# Patient Record
Sex: Female | Born: 1940 | Race: White | Hispanic: No | Marital: Married | Smoking: Former smoker
Health system: Southern US, Community
[De-identification: ages and names within clinical notes are randomized; demographics above are authoritative.]

## PROBLEM LIST (undated history)

## (undated) DIAGNOSIS — M1990 Unspecified osteoarthritis, unspecified site: Secondary | ICD-10-CM

## (undated) DIAGNOSIS — I10 Essential (primary) hypertension: Secondary | ICD-10-CM

## (undated) DIAGNOSIS — K219 Gastro-esophageal reflux disease without esophagitis: Secondary | ICD-10-CM

## (undated) DIAGNOSIS — E785 Hyperlipidemia, unspecified: Secondary | ICD-10-CM

## (undated) DIAGNOSIS — R55 Syncope and collapse: Secondary | ICD-10-CM

## (undated) DIAGNOSIS — I6529 Occlusion and stenosis of unspecified carotid artery: Secondary | ICD-10-CM

## (undated) DIAGNOSIS — I639 Cerebral infarction, unspecified: Secondary | ICD-10-CM

## (undated) HISTORY — DX: Essential (primary) hypertension: I10

## (undated) HISTORY — PX: CAROTID ENDARTERECTOMY: SUR193

## (undated) HISTORY — DX: Cerebral infarction, unspecified: I63.9

## (undated) HISTORY — DX: Gastro-esophageal reflux disease without esophagitis: K21.9

## (undated) HISTORY — DX: Occlusion and stenosis of unspecified carotid artery: I65.29

## (undated) HISTORY — DX: Unspecified osteoarthritis, unspecified site: M19.90

## (undated) HISTORY — PX: ABDOMINAL HYSTERECTOMY: SHX81

## (undated) HISTORY — DX: Syncope and collapse: R55

## (undated) HISTORY — PX: CORONARY ARTERY BYPASS GRAFT: SHX141

## (undated) HISTORY — DX: Hyperlipidemia, unspecified: E78.5

---

## 2003-01-02 DIAGNOSIS — I639 Cerebral infarction, unspecified: Secondary | ICD-10-CM

## 2003-01-02 HISTORY — DX: Cerebral infarction, unspecified: I63.9

## 2005-10-29 ENCOUNTER — Ambulatory Visit: Payer: Self-pay | Admitting: Oncology

## 2006-01-08 ENCOUNTER — Inpatient Hospital Stay (HOSPITAL_COMMUNITY): Admission: RE | Admit: 2006-01-08 | Discharge: 2006-01-09 | Payer: Self-pay | Admitting: Vascular Surgery

## 2006-01-08 ENCOUNTER — Encounter (INDEPENDENT_AMBULATORY_CARE_PROVIDER_SITE_OTHER): Payer: Self-pay | Admitting: Specialist

## 2006-08-09 ENCOUNTER — Ambulatory Visit: Payer: Self-pay | Admitting: Vascular Surgery

## 2007-02-28 ENCOUNTER — Ambulatory Visit: Payer: Self-pay | Admitting: Vascular Surgery

## 2008-02-13 ENCOUNTER — Ambulatory Visit: Payer: Self-pay | Admitting: Vascular Surgery

## 2009-02-18 ENCOUNTER — Ambulatory Visit: Payer: Self-pay | Admitting: Vascular Surgery

## 2010-02-21 ENCOUNTER — Other Ambulatory Visit (INDEPENDENT_AMBULATORY_CARE_PROVIDER_SITE_OTHER): Payer: Medicare Other

## 2010-02-21 DIAGNOSIS — I6529 Occlusion and stenosis of unspecified carotid artery: Secondary | ICD-10-CM

## 2010-02-21 DIAGNOSIS — Z48812 Encounter for surgical aftercare following surgery on the circulatory system: Secondary | ICD-10-CM

## 2010-02-28 NOTE — Procedures (Unsigned)
CAROTID DUPLEX EXAM  INDICATION:  Right carotid endarterectomy.  HISTORY: Diabetes:  No. Cardiac:  No. Hypertension:  Yes. Smoking:  Previous. Previous Surgery:  Right carotid endarterectomy on 01/08/2006. CV History:  TIA in October 2009. Amaurosis Fugax No, Paresthesias No, Hemiparesis No.                                      RIGHT             LEFT Brachial systolic pressure:         144               142 Brachial Doppler waveforms:         Normal            Normal Vertebral direction of flow:        Antegrade         Antegrade DUPLEX VELOCITIES (cm/sec) CCA peak systolic                   79                76 ECA peak systolic                   95                100 ICA peak systolic                   88                92 ICA end diastolic                   15                24 PLAQUE MORPHOLOGY:                                    Heterogenous PLAQUE AMOUNT:                      None              Mild PLAQUE LOCATION:                                      ICA/ECA  IMPRESSION: 1. Patent right carotid endarterectomy site with no right internal     carotid artery stenosis. 2. No hemodynamically significant stenosis of the left internal     carotid artery with plaque formations as described above. 3. Doppler velocities of the right internal carotid artery are less     than previously recorded when compared to the previous examination     on 02/18/2009 with the left internal carotid artery remaining     stable.  ___________________________________________ Larina Earthly, M.D.  CH/MEDQ  D:  02/21/2010  T:  02/21/2010  Job:  [id number]

## 2010-05-16 NOTE — Procedures (Signed)
CAROTID DUPLEX EXAM   INDICATION:  Followup carotid disease.   HISTORY:  Diabetes:  No  Cardiac:  No  Hypertension:  Yes  Smoking:  No  Previous Surgery:  Right carotid endarterectomy with DPA on 01/08/2006,  by Dr. Arbie Cookey.  CV History:  Amaurosis Fugax No, Paresthesias No, Hemiparesis No                                       RIGHT             LEFT  Brachial systolic pressure:         164               168  Brachial Doppler waveforms:         triphasic         triphasic  Vertebral direction of flow:        antegrade         antegrade  DUPLEX VELOCITIES (cm/sec)  CCA peak systolic                   74                70  ECA peak systolic                   157               79  ICA peak systolic                   68                66  ICA end diastolic                   17                15  PLAQUE MORPHOLOGY:                  calcified         calcified  PLAQUE AMOUNT:                      mild to moderate  mild  PLAQUE LOCATION:                    ECA               ICA   IMPRESSION:  1. Right external carotid artery stenosis.  2. No right internal carotid artery stenosis, status post      endarterectomy.  3. A 20-39% left internal carotid artery stenosis.   ___________________________________________  Larina Earthly, M.D.   DP/MEDQ  D:  08/09/2006  T:  08/09/2006  Job:  [id number]

## 2010-05-16 NOTE — Procedures (Signed)
CAROTID DUPLEX EXAM   INDICATION:  Followup of carotid artery disease.  Patient is  asymptomatic.   HISTORY:  Diabetes:  No.  Cardiac:  No.  Hypertension:  Yes.  Smoking:  No.  Previous Surgery:  Right CEA with DPA on 01/08/2006 by Dr. Arbie Cookey.  CV History:  Amaurosis Fugax No, Paresthesias No, Hemiparesis No                                       RIGHT             LEFT  Brachial systolic pressure:         154               160  Brachial Doppler waveforms:         WNL               WNL  Vertebral direction of flow:        Antegrade         Antegrade  DUPLEX VELOCITIES (cm/sec)  CCA peak systolic                   79                82  ECA peak systolic                   114               102  ICA peak systolic                   107 (M)           91  ICA end diastolic                   38                29  PLAQUE MORPHOLOGY:                  Heterogeneous     Mixed, calcific  with shadowing  PLAQUE AMOUNT:                      Intimal thickening                  Mild-moderate  PLAQUE LOCATION:                    Bifurcation       Bifurcation, ICA   IMPRESSION:  1. Right 20-39% ICA stenosis with highest velocities noted at the      distal arteriotomy site.  2. Left 20-39% ICA stenosis.  3. Increased velocities noted in bilateral ICAs, however,      categorically unchanged from study on 08/09/2006.   ___________________________________________  Larina Earthly, M.D.   PB/MEDQ  D:  02/28/2007  T:  03/02/2007  Job:  [id number]

## 2010-05-16 NOTE — Procedures (Signed)
CAROTID DUPLEX EXAM   INDICATION:  Followup known carotid artery disease.   HISTORY:  Diabetes:  No.  Cardiac:  No.  Hypertension:  Yes.  Smoking:  Previous.  Previous Surgery:  Right side carotid endarterectomy 01/08/2006.  CV History:  TIA in October 2009.  Amaurosis Fugax No, Paresthesias No, Hemiparesis No                                       RIGHT             LEFT  Brachial systolic pressure:         160               160  Brachial Doppler waveforms:         WNL               WNL  Vertebral direction of flow:        Antegrade         Antegrade  DUPLEX VELOCITIES (cm/sec)  CCA peak systolic                   94                95  ECA peak systolic                   107               91  ICA peak systolic                   126               97  ICA end diastolic                   40                29  PLAQUE MORPHOLOGY:                                    Heterogeneous  PLAQUE AMOUNT:                                        Mild  PLAQUE LOCATION:                                      ICA   IMPRESSION:  1. No restenosis noted in right internal carotid artery status post      carotid endarterectomy.  2. The left internal carotid artery appears with 20%-39% stenosis.  3. Antegrade flow in bilateral vertebrals.   ___________________________________________  Larina Earthly, M.D.   CB/MEDQ  D:  02/18/2009  T:  02/18/2009  Job:  [id number]

## 2010-05-16 NOTE — Procedures (Signed)
CAROTID DUPLEX EXAM   INDICATION:  Followup right carotid endarterectomy.   HISTORY:  Diabetes:  No.  Cardiac:  No.  Hypertension:  Yes.  Smoking:  No.  Previous Surgery:  Right carotid endarterectomy on 01/08/2006.  CV History:  The patient states a stress induced mini stroke in October  of 2009.  Amaurosis Fugax No, Paresthesias No, Hemiparesis No                                       RIGHT             LEFT  Brachial systolic pressure:         152               158  Brachial Doppler waveforms:         Normal            Normal  Vertebral direction of flow:        Antegrade         Antegrade  DUPLEX VELOCITIES (cm/sec)  CCA peak systolic                   92                85  ECA peak systolic                   120               103  ICA peak systolic                   121               88  ICA end diastolic                   41                22  PLAQUE MORPHOLOGY:                                    Mixed  PLAQUE AMOUNT:                      None              Mild  PLAQUE LOCATION:                                      ICA   IMPRESSION:  1. Patent right carotid endarterectomy site with mildly increased      Doppler velocity noted in the proximal to mid ICA.  This increase      in velocity appears to be most likely due to a change in vessel      diameter since no focal plaque formation was visualized.  2. 1-39% stenosis of the left internal carotid artery.  3. Mild increase in Doppler velocities of the right internal carotid      artery noted when compared to the previous exam on 02/28/2007 with      the left internal carotid artery remaining stable.   ___________________________________________  Larina Earthly, M.D.   CH/MEDQ  D:  02/13/2008  T:  02/13/2008  Job:  [id number] 

## 2010-05-19 NOTE — H&P (Signed)
Susan [name], Susan [name]                 ACCOUNT NO.:  [id number]   MEDICAL RECORD NO.:  [id number]          PATIENT TYPE:  INP   LOCATION:  NA                           FACILITY:  [facility]   PHYSICIAN:  Rowe Clack, P.A.-C. DATE OF BIRTH:  1940/10/17   DATE OF ADMISSION:  01/08/2006  DATE OF DISCHARGE:                              HISTORY & PHYSICAL   CHIEF COMPLAINT:  Right internal carotid artery stenosis.   HISTORY OF PRESENT ILLNESS:  The patient is a 70 year old white female  who has been evaluated for carotid disease status post an episode in  October that was felt to be a possible TIA.  This episode was  approximately 15 minutes in duration and she developed acute onset of  numbness in the right foot and hand while shopping.  This episode  resolved completely and has not recurred.  She has been evaluated with  studies including CT angiography and duplex of the carotid arteries.  This has revealed what is felt to be significant stenosis in the right  internal carotid artery.  The angiogram revealed severe stenosis in the  proximal right ICA with severe calcification at the carotid bifurcation.  She was found on this study to have no significant left internal artery  stenosis.  Her duplex predicted a 60-79% stenosis, but it was Dr.  Bosie Helper opinion that this was an underestimation due to the inability to  visualize the calcified plaque.  During this evaluation the patient was  also found to have a heterozygous Factor 5 Leiden mutation.  This is  felt to be the most likely etiology of her symptoms as the carotid  studies do not correlate with the right-sided episode.  However, due to  the severity of the stenosis on the right side, it was Dr. Bosie Helper  recommendation to proceed with a right carotid endarterectomy so as to  lower her risk of cerebrovascular accident.  She will be admitted this  hospitalization for the procedure.  Currently the patient is completely  asymptomatic and  denies headaches, nausea, vomiting, vertigo, dizziness,  history of recent falls, seizures, numbness, tingling, muscle weakness,  dysarthria, dysphasia, recent visual changes, syncope, presyncope,  memory loss, confusion, or other TIA/CVA symptoms other than the  original event.   PAST MEDICAL HISTORY:  1. 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  2. Factor 5 Leiden mutation.  3. Hypertension.  4. Hypercholesterolemia.   PAST SURGICAL HISTORY:  Partial hysterectomy 1975.   ALLERGIES:  NO KNOWN DRUG ALLERGIES.   CURRENT MEDICATIONS:  1. Lisinopril 10 mg daily.  2. Zocor 20 mg q. p.m.  3. Enteric coated aspirin 325 mg daily.  4. Omega 3 fish oil 1000 mg b.i.d.  5. Niacin 500 mg b.i.d.  6. Xanax 0.125 mg daily.   REVIEW OF SYSTEMS:  See the history of present illness for pertinent  positives and negatives, otherwise she denies contributory.   SOCIAL HISTORY:  She is married with 1 child.  She quit tobacco 20 to 25  years ago and smoked for approximately 15 years at an average of 3/4 of  1 pack per day.  Alcohol use, none.  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   FAMILY HISTORY:  Is remarkable for her father having multiple  cerebrovascular accidents and was deceased following massive myocardial  infarction.  She has one brother who died of colon cancer.  One sister  who died of cancer as well; however, it was metastatic when found and no  primary is known to the patient.   PHYSICAL EXAMINATION:  VITAL SIGNS:  Blood pressure 108/58, heart rate  68, respirations 12.  GENERAL:  A 70 year old Caucasian female in no acute distress.  HEENT:  Normocephalic, atraumatic.  Pupils, equal, round, reactive to  light, extraocular movements intact.  Oral mucosa is pink with no  lesions.  There is no pharyngeal exudates or erythema.  Sclerae  nonicteric.  She does have a upper  denture.  NECK:  Supple, no jugular venous distention.  She does have a left  carotid bruit.  No lymphadenopathy.  No thyromegaly.  PULMONARY:  Symmetrical and inspiration unlabored.  Clear breath sounds.  CARDIAC:  Regular rate and rhythm, no murmurs, gallops, or rubs.  ABDOMINAL:  Soft, nontender, nondistended.  Normal active bowel sounds.  No masses, bruits, or hepatosplenomegaly.  GU/RECTAL:  Deferred.  EXTREMITIES:  No edema, varicosities, or venous stasis.  Her peripheral  pulses are equal intact bilaterally.  Extremities are warm.  NEUROLOGICAL:  Grossly nonfocal.  Alert and oriented x4.  Gait is  steady.  No asymmetry findings.  Muscle strength is 5+ and equal  bilaterally.  Cranial nerves II-XII grossly intact.  Deep tendon  reflexes are intact at 2+.   ASSESSMENT:  1. Severe right internal carotid artery stenosis.  2. Factor 5 Leiden mutation with possible hypocoagulability as      etiology of her right-sided symptoms.   Other diagnoses as previously listed per the history.   PLAN:  Right carotid endarterectomy on January 08, 2006 per Dr. Arbie Cookey.      Rowe Clack, P.A.-C.     Sherryll Burger  D:  01/04/2006  T:  01/04/2006  Job:  [id number]   cc:   Larina Earthly, M.D.  Dr. Lawana Pai

## 2010-05-19 NOTE — Op Note (Signed)
NAMETHERASA, [name] NO.:  [id number]   MEDICAL RECORD NO.:  [id number]          PATIENT TYPE:  INP   LOCATION:  2899                         FACILITY:  [facility]   PHYSICIAN:  Larina Earthly, M.D.    DATE OF BIRTH:  April 04, 1940   DATE OF PROCEDURE:  01/08/2006  DATE OF DISCHARGE:                               OPERATIVE REPORT   PREOPERATIVE DIAGNOSIS:  Severe asymptomatic right internal carotid  stenosis.   POSTOPERATIVE DIAGNOSIS:  Severe asymptomatic right internal carotid  stenosis.   PROCEDURES:  Right carotid endarterectomy and Dacron patch angioplasty.   SURGEON:  Larina Earthly, M.D.   ASSISTANT:  Jerold Coombe, P.A.   ANESTHESIA:  General endotracheal.   COMPLICATIONS:  None.   DISPOSITION:  To recovery room stable.   PROCEDURE IN DETAIL:  The patient was taken to operating, placed supine  position where the area the right neck prepped and draped usual sterile  fashion.  Incision made anterior sternocleidomastoid, carried down  through the platysma with electrocautery.  Sternocleidomastoid reflected  posteriorly and the carotid sheath was opened.  The facial vein was  ligated with 2-0 silk ties and divided.  The common carotid artery was  encircled with an umbilical tape and Rumel tourniquet and had minimal  plaque.  The patient had extensively calcified plaque in the carotid  bulb extending into the internal carotid artery.  The superior thyroid  artery was encircled with a 2-0 silk Potts tie.  The external carotid  was encircled with blue vessel loop.  The internal carotid was encircled  with umbilical tape and Rumel tourniquet.  The patient was given 7000  units intravenous heparin.  After adequate circulation time, the  internal, external and common carotid arteries were occluded.  Common  carotid artery was opened with an 11 blade and extended longitudinally  with Potts scissors through the plaque onto the internal carotid artery.  A 10  shunt was passed up the internal carotid, allowed to back bleed,  and then the common carotid where it was secured with Rumel tourniquets.  The vagus and hypoglossal nerves were identified and preserved.  Endarterectomy was begun on the common carotid artery.  The plaque was  divided proximally with Potts scissors.  The endarterectomy extended  onto the bifurcation.  The external carotid was endarterectomized with  eversion technique and the internal carotid was endarterectomized in  open fashion.  Remaining atheromatous debris was removed from the  endarterectomy plane.  A Finesse Hemashield Dacron patch was brought  onto the field and sewn as a patch angioplasty with a running 6-0  Prolene suture.  Prior to completion of the anastomosis, the shunt was  removed and the usual flushing maneuvers were undertaken.  The  anastomosis was completed and flow was restored first to the external  and then the common, and finally the internal carotid artery occlusion  clamp was removed.  Excellent flow characteristics were noted with  handheld Doppler in the internal, external carotid arteries.  The  patient was given 50 mg of protamine to reverse heparin.  The wounds  were irrigated with saline.  Hemostasis electrocautery.  Wounds closed 3-  0 Vicryl subcutaneous tissue and subcuticular tissue and the wounds was  closed with 3-0 Vicryl to reapproximate  sternocleidomastoid over the carotid sheath.  Next the platysma was  closed to running 3-0 Vicryl sutures and the skin was closed with 4-0  subcuticular Vicryl stitch.  Sterile dressing was applied.  The patient  was taken to the recovery room neurologically intact in stable  condition.      Larina Earthly, M.D.  Electronically Signed     TFE/MEDQ  D:  01/08/2006  T:  01/08/2006  Job:  [id number]   cc:   Dr Veverly Fells Lininger  Dr Levi Aland

## 2010-05-19 NOTE — Discharge Summary (Signed)
[name], SUPPA NO.:  [id number]   MEDICAL RECORD NO.:  [id number]          PATIENT TYPE:  INP   LOCATION:  3310                         FACILITY:  [facility]   PHYSICIAN:  Larina Earthly, M.D.    DATE OF BIRTH:  04-May-1940   DATE OF ADMISSION:  01/08/2006  DATE OF DISCHARGE:  01/09/2006                               DISCHARGE SUMMARY   ADMISSION DIAGNOSIS:  History of severe right internal carotid artery  stenosis.   DISCHARGE/SECONDARY DIAGNOSES:  1. Severe right internal carotid artery stenosis status post right      carotid endarterectomy.  2. History of Factor V Leiden mutation.  3. Hypertension.  4. Hypercholesterolemia.  5. History of partial hysterectomy in 1975.   NO KNOWN DRUG ALLERGIES.   PROCEDURES:  January 08, 2006, right carotid endarterectomy with Dacron  patch angioplasty by Dr. Gretta Began.   BRIEF HISTORY:  Susan [name] is a 70 year old Caucasian female who was  evaluated for carotid disease in October of 2007 after she had an  episode that was possibly due to a  TIA.  The episode was approximately  50 minutes in duration and involved acute onset of numbness in her right  foot and hand while shopping.  The episode resolved completely, and she  had no recurrence.  She subsequently underwent a CT angiography and  duplex of the carotid arteries, which revealed what was thought to be  significant stenosis in the right internal carotid artery.  Angiogram  revealed severe stenosis in the proximal right ICA with severe  calcification in the carotid bifurcation.  She was found in this study  to have no significant left internal carotid artery stenosis.  Her  duplex predicted a 60%-79% stenosis, but it was Dr. Bosie Helper opinion that  it was underestimated due to the inability to visualize calcified  plaque.  During this evaluation, the patient was also found to have a  heterozygous Factor V Leiden mutation.  This was felt to be the more  likely  etiology of her symptoms as her carotid studies did not correlate  with the right-sided episode.  However, due to the severity of the  stenosis on the right, Dr. Arbie Cookey recommended that she proceed with a  right carotid endarterectomy to reduce her risk for cerebrovascular  accident.  After discussing risks and benefits, she agreed to proceed.  As previously described, she was asymptomatic.   HOSPITAL COURSE:  Susan [name] was likely admitted to [facility]  on January 08, 2006, and underwent right carotid endarterectomy.  She was  extubated and neurologically intact.  After a short stay in the recovery  unit , she was transferred to a step-down unit at 3300, where she  remained until discharge.  Susan [name] had an uneventful postoperative  course.  Her vitals remained stable.  She did have some postoperative  mild bradycardia from 50-60 beats per minute; however, this correlated  with her baseline EKG showing sinus bradycardia at 54.  Blood pressure  at 115/42, oxygen saturation 94% on room air.  Postoperative labs were  also stable  showing a white blood count of 10.2, hemoglobin 11.3,  hematocrit 32.9, platelet count 214.  Sodium 137, potassium 3.9, BUN 6,  creatinine 0.77, blood glucose 123.   PHYSICAL EXAMINATION:  HEART:  Regular rate and rhythm.  LUNGS:  Clear.  ABDOMEN:  Benign.  EXTREMITIES:  No edema.  NEUROLOGICALLY:  Intact with tongue in midline.  Her neck incision did  show evidence of small hematoma, the distal incision with some mild soft  tissue edema.  She denies any dysphagia or shortness of breath.   That morning, she was able to tolerate a regular diet, __________  removal of her Foley catheter and ambulate without difficulty.  The pain  was controlled with oral medication.  Basically, she was ready for  discharge home in the afternoon of postoperative day 1, January 09, 2006.   DISCHARGE INSTRUCTIONS:  1. Tylox 1 to 2 tablets p.o. q.4 h. p.r.n. pain.  2.  Lisinopril 10 mg p.o. daily.  3. Zocor 20 mg p.o. q.p.m.  4. Aspirin 325 mg p.o. daily.  5. Omega-3 fish oil capsule 1000 mg p.o. b.i.d.  6. Niaspan 500 mg p.o. b.i.d.  7. Xanax 0.125 mg p.o. daily.   DISCHARGE INSTRUCTIONS:  She is instructed to follow a low-cholesterol  diet, to increase her activity slowly, to avoid driving.  No heavy  lifting for the next 2 weeks.  She may shower or clean her incisions  gently with soap and water.  She should call us if she develops a fever  greater than 101 or redness or drainage from her incision site.  She is  to follow up with Dr. Tawanna Cooler Early at the [facility] office on January 28, 2006,  at 10:30 a.m.  To call sooner if needed.      Jerold Coombe, P.A.      Larina Earthly, M.D.  Electronically Signed    AWZ/MEDQ  D:  02/04/2006  T:  02/05/2006  Job:  [id number]   cc:   Quita Skye. Artis Flock, M.D.

## 2010-12-01 DIAGNOSIS — I2510 Atherosclerotic heart disease of native coronary artery without angina pectoris: Secondary | ICD-10-CM

## 2010-12-04 DIAGNOSIS — I2510 Atherosclerotic heart disease of native coronary artery without angina pectoris: Secondary | ICD-10-CM

## 2011-01-17 ENCOUNTER — Ambulatory Visit (INDEPENDENT_AMBULATORY_CARE_PROVIDER_SITE_OTHER): Payer: Self-pay | Admitting: Physician Assistant

## 2011-01-17 DIAGNOSIS — Z951 Presence of aortocoronary bypass graft: Secondary | ICD-10-CM

## 2011-01-17 NOTE — Progress Notes (Signed)
Ms. Susan [name] return to the office today for scheduled followup after three-vessel coronary bypass grafting by Dr. Arvilla Market on 12/04/2010. Her postoperative course was uncomplicated. She is continuing to make excellent progress since her discharge home. She denies any problems including chest pain or shortness of breath. She continues to have some mild peristernal soreness at this is also improving.  Her medications are reviewed and have not changed since her discharge home. She is seeing Dr. Brett [name] car in [place] for regular cardiology followup.  On exam: Her heart is in a regular rate and rhythm. Her breath sounds are clear to auscultation and are full and equal. The sternotomy incision is well healed. Sternum stable. She has no peripheral edema. A suture is removed from the stab incision at the left lower leg.  Impression: Ms. Susan [name] is making satisfactory progress after three-vessel coronary bypass grafting. Sternal precautions are reviewed.  No changes are made in her medications. She has begun heart strides in is encouraged to continue with his program at the [facility]. No followup is scheduled with our office but she understands he may contact us or return should any problems arise related to her surgery.

## 2011-02-06 ENCOUNTER — Other Ambulatory Visit: Payer: Self-pay | Admitting: Cardiothoracic Surgery

## 2011-03-07 ENCOUNTER — Encounter: Payer: Self-pay | Admitting: Vascular Surgery

## 2011-03-08 ENCOUNTER — Other Ambulatory Visit (INDEPENDENT_AMBULATORY_CARE_PROVIDER_SITE_OTHER): Payer: Medicare Other | Admitting: *Deleted

## 2011-03-08 ENCOUNTER — Ambulatory Visit (INDEPENDENT_AMBULATORY_CARE_PROVIDER_SITE_OTHER): Payer: Medicare Other | Admitting: Neurosurgery

## 2011-03-08 ENCOUNTER — Other Ambulatory Visit: Payer: Self-pay | Admitting: Physician Assistant

## 2011-03-08 VITALS — BP 137/63 | HR 54 | Resp 18 | Ht 63.5 in | Wt 126.0 lb

## 2011-03-08 DIAGNOSIS — I6529 Occlusion and stenosis of unspecified carotid artery: Secondary | ICD-10-CM

## 2011-03-08 DIAGNOSIS — I6522 Occlusion and stenosis of left carotid artery: Secondary | ICD-10-CM | POA: Insufficient documentation

## 2011-03-08 DIAGNOSIS — Z48812 Encounter for surgical aftercare following surgery on the circulatory system: Secondary | ICD-10-CM

## 2011-03-08 NOTE — Progress Notes (Signed)
[facility] [place] HISTORY AND PHYSICAL   CC: Dr. Leonor Liv PCP Referring Physician: Dr.Early  History of Present Illness: Susan [name] is a 71 year old female tha is a patient of Dr.Early. The patient underwent a right carotid endarterectomy in 2008 and has done well since that time. The patient also today for a carotid duplex and was seen in our clinic. She has no new complaints of any symptoms or signs of a CVA nor does she have any visual disturbance or dysplasia  Past Medical History  Diagnosis Date  . Carotid artery occlusion   . Hypertension   . Hyperlipidemia   . Arthritis     ROS: [x]  Positive   [ ]  Denies    General: [ ]  Weight loss, [ ]  Fever, [ ]  chills Neurologic: [ ]  Dizziness, [ ]  Blackouts, [ ]  Seizure [ ]  Stroke, [ ]  "Mini stroke", [ ]  Slurred speech, [ ]  Temporary blindness; [ ]  weakness in arms or legs, [ ]  Hoarseness Cardiac: [ ]  Chest pain/pressure, [ ]  Shortness of breath at rest [ ]  Shortness of breath with exertion, [ ]  Atrial fibrillation or irregular heartbeat Vascular: [ ]  Pain in legs with walking, [ ]  Pain in legs at rest, [ ]  Pain in legs at night,  [ ]  Non-healing ulcer, [ ]  Blood clot in vein/DVT,   Pulmonary: [ ]  Home oxygen, [ ]  Productive cough, [ ]  Coughing up blood, [ ]  Asthma,  [ ]  Wheezing Musculoskeletal:  [ ]  Arthritis, [ ]  Low back pain, [ ]  Joint pain Hematologic: [ ]  Easy Bruising, [ ]  Anemia; [ ]  Hepatitis Gastrointestinal: [ ]  Blood in stool, [ ]  Gastroesophageal Reflux/heartburn, [ ]  Trouble swallowing Urinary: [ ]  chronic Kidney disease, [ ]  on HD - [ ]  MWF or [ ]  TTHS, [ ]  Burning with urination, [ ]  Difficulty urinating Skin: [ ]  Rashes, [ ]  Wounds Psychological: [ ]  Anxiety, [ ]  Depression   Social History History  Substance Use Topics  . Smoking status: Former Smoker -- 0.5 packs/day for 20 years    Types: Cigarettes    Quit date: 03/07/1981  . Smokeless tobacco: Never Used  . Alcohol Use: No    Family  History Family History  Problem Relation Age of Onset  . Heart disease Father     No Known Allergies  Current Outpatient Prescriptions  Medication Sig Dispense Refill  . aspirin 81 MG tablet Take 160 mg by mouth daily.      . calcium-vitamin D 250-100 MG-UNIT per tablet Take 1 tablet by mouth 1 day or 1 dose.      . carvedilol (COREG) 6.25 MG tablet Take 6.25 mg by mouth 2 (two) times daily with a meal.      . Omega-3-acid Ethyl Esters (LOVAZA PO) Take 1,000 mg by mouth daily.      . ramipril (ALTACE) 5 MG capsule Take 5 mg by mouth.      . esomeprazole (NEXIUM) 40 MG capsule Take 40 mg by mouth daily before breakfast.        Physical Examination  Filed Vitals:   03/08/11 1424  BP: 137/63  Pulse: 54  Resp: 18    Body mass index is 21.97 kg/(m^2).  General:  WDWN in NAD Gait: Normal HEENT: WNL Eyes: Pupils equal Pulmonary: normal non-labored breathing , without Rales, rhonchi,  wheezing Cardiac: RRR, without  Murmurs, rubs or gallops; the patient does have a recent history of a three-vessel bypass graft there was  some pleaded at [facility] in December of 2012. She nor her husband can remember the physician's name and and we have no records from there for review Abdomen: soft, NT, no masses Skin: no rashes, ulcers noted  Vascular Exam/Pulses: Patient has palpable distal pulses in the lower extremities bilaterally, 1+ with the dorsalis pedis as well as the posterior tibial. Carotid bruits; there are no audible carotid bruits Extremities without ischemic changes, no Gangrene , no cellulitis; no open wounds;  Musculoskeletal: no muscle wasting or atrophy   Neurologic: A&O X 3; Appropriate Affect ; SENSATION: normal; MOTOR FUNCTION:  moving all extremities equally. Speech is fluent/normal  Non-Invasive Vascular Imaging CAROTID DUPLEX 03/08/2011  Right ICA 0 - 19% stenosis Left ICA 20 - 39 % stenosis   ASSESSMENT/PLAN: The patient will followup in my clinic in one  year with repeat carotid duplex. If they have any difficulty and in her only noted to contact our office or if there any signs of cerebrovascular accident as well as TIA 2 go to the medicine emergency room or nearest emergency room in their vicinity   Clinic MD Dr. Darrick Penna

## 2011-03-08 NOTE — Patient Instructions (Signed)
Discharge Instructions Browse by Alphabet A B C D E F G H I J K L M N O P Q R S T U V W X Y Z  Browse by Category All Documents Allergy and Immunology Anesthesiology Bariatrics Bioterrorism Cardiology Critical Care Dentistry Dermatology Diabetes Dietary Easy-to-Read Emergency Medicine Endocrinology ENT Family Medicine Forms Gastroenterology Geriatrics Infectious Disease Internal Medicine Labs and Tests Neonatology Nephrology Neurology Obstetrics and Gynecology Oncology Ophthalmology Orthopedics Pediatrics Pharmacology Physical Medicine and Rehabilitation Podiatry Preventative Medicine Procedures Psychiatry Pulmonary Medicine Radiology Rheumatology Surgery Urology Drug Information Sheets All Drug Information Sheets  Browse by Alphabet A B C D E F G H I J K L M N O P Q R S T U V W X Y Z  

## 2011-03-15 NOTE — Procedures (Unsigned)
CAROTID DUPLEX EXAM  INDICATION:  Follow up right CEA.  HISTORY: Diabetes:  No. Cardiac:  Yes. Hypertension:  Yes. Smoking:  Previous. Previous Surgery:  Right CEA, 01/08/2006. CV History:  TIA, 2009. Amaurosis Fugax No, Paresthesias No, Hemiparesis No.                                      RIGHT             LEFT Brachial systolic pressure:         130               130 Brachial Doppler waveforms:         WNL               WNL Vertebral direction of flow:        Antegrade         Antegrade DUPLEX VELOCITIES (cm/sec) CCA peak systolic                   85                102 ECA peak systolic                   104               80 ICA peak systolic                   68                104 ICA end diastolic                   12                29 PLAQUE MORPHOLOGY:                                    Calcific PLAQUE AMOUNT:                      NA                Mild PLAQUE LOCATION:                                      ICA  IMPRESSION: 1. Widely patent right internal carotid artery without evidence of     hyperplasia or restenosis. 2. 1% to 39% left internal carotid artery stenosis, which is calcific     and may under-estimate velocities. 3. Bilateral vertebral arteries are within normal limits.  ___________________________________________ Larina Earthly, M.D.  LT/MEDQ  D:  03/08/2011  T:  03/08/2011  Job:  [id number]

## 2012-03-03 ENCOUNTER — Encounter: Payer: Self-pay | Admitting: Vascular Surgery

## 2012-03-04 ENCOUNTER — Ambulatory Visit: Payer: Medicare Other | Admitting: Vascular Surgery

## 2012-03-04 ENCOUNTER — Other Ambulatory Visit: Payer: Medicare Other

## 2012-04-16 ENCOUNTER — Other Ambulatory Visit: Payer: Self-pay | Admitting: *Deleted

## 2012-04-16 DIAGNOSIS — Z48812 Encounter for surgical aftercare following surgery on the circulatory system: Secondary | ICD-10-CM

## 2012-04-28 ENCOUNTER — Encounter: Payer: Self-pay | Admitting: Vascular Surgery

## 2012-04-29 ENCOUNTER — Other Ambulatory Visit (INDEPENDENT_AMBULATORY_CARE_PROVIDER_SITE_OTHER): Payer: Medicare Other | Admitting: Vascular Surgery

## 2012-04-29 ENCOUNTER — Encounter: Payer: Self-pay | Admitting: Vascular Surgery

## 2012-04-29 ENCOUNTER — Ambulatory Visit (INDEPENDENT_AMBULATORY_CARE_PROVIDER_SITE_OTHER): Payer: Medicare Other | Admitting: Vascular Surgery

## 2012-04-29 DIAGNOSIS — I6529 Occlusion and stenosis of unspecified carotid artery: Secondary | ICD-10-CM

## 2012-04-29 DIAGNOSIS — Z48812 Encounter for surgical aftercare following surgery on the circulatory system: Secondary | ICD-10-CM

## 2012-04-29 NOTE — Addendum Note (Signed)
Addended by: Adria Dill L on: 04/29/2012 03:11 PM   Modules accepted: Orders

## 2012-04-29 NOTE — Progress Notes (Signed)
Patient has today for followup of her right carotid endarterectomy for severe asymptomatic disease in 2008. She continues to do well and has had no neurologic deficits. She did undergo coronary bypass grafting in 2011 which was uneventful.  Past Medical History  Diagnosis Date  . Carotid artery occlusion   . Hypertension   . Hyperlipidemia   . Arthritis     History  Substance Use Topics  . Smoking status: Former Smoker -- 0.50 packs/day for 20 years    Types: Cigarettes    Quit date: 03/07/1981  . Smokeless tobacco: Never Used  . Alcohol Use: No    Family History  Problem Relation Age of Onset  . Heart disease Father     No Known Allergies  Current outpatient prescriptions:aspirin 81 MG tablet, Take 160 mg by mouth daily., Disp: , Rfl: ;  calcium-vitamin D 250-100 MG-UNIT per tablet, Take 1 tablet by mouth 1 day or 1 dose., Disp: , Rfl: ;  carvedilol (COREG) 6.25 MG tablet, Take 6.25 mg by mouth 2 (two) times daily with a meal., Disp: , Rfl: ;  cilostazol (PLETAL) 100 MG tablet, Take 100 mg by mouth 2 (two) times daily., Disp: , Rfl:  esomeprazole (NEXIUM) 40 MG capsule, Take 40 mg by mouth daily before breakfast., Disp: , Rfl: ;  niacin 500 MG tablet, Take 500 mg by mouth daily with breakfast., Disp: , Rfl: ;  Omega-3-acid Ethyl Esters (LOVAZA PO), Take 1,000 mg by mouth daily., Disp: , Rfl: ;  pravastatin (PRAVACHOL) 40 MG tablet, Take 40 mg by mouth daily., Disp: , Rfl: ;  ramipril (ALTACE) 5 MG capsule, Take 5 mg by mouth., Disp: , Rfl:   BP 154/73  Pulse 54  Temp(Src) 98 F (36.7 C) (Oral)  Resp 16  Ht 5' 3.5" (1.613 m)  Wt 133 lb (60.328 kg)  BMI 23.19 kg/m2  SpO2 99%  Body mass index is 23.19 kg/(m^2).       Is exam well-developed well-nourished white female appearing stated age. Grossly intact neurologically Her right neck incision is well-healed and she has no carotid bruits bilaterally Heart regular rate and rhythm Pulse status 2+ radial pulses  bilaterally Respirations equal and nonlabored  Noninvasive vascular lab was evaluated interpreted by myself. This reveals widely patent right endarterectomy and no significant stenosis in the left internal carotid artery  Impression and plan: Stable status post right carotid endarterectomy 2008. She will continue her usual activities. We will see her in one year with repeat carotid duplex surveillance

## 2013-05-05 ENCOUNTER — Ambulatory Visit: Payer: Medicare Other | Admitting: Vascular Surgery

## 2013-05-05 ENCOUNTER — Other Ambulatory Visit (HOSPITAL_COMMUNITY): Payer: Self-pay

## 2014-01-15 DIAGNOSIS — I10 Essential (primary) hypertension: Secondary | ICD-10-CM | POA: Diagnosis not present

## 2014-01-15 DIAGNOSIS — E780 Pure hypercholesterolemia: Secondary | ICD-10-CM | POA: Diagnosis not present

## 2014-01-15 DIAGNOSIS — K219 Gastro-esophageal reflux disease without esophagitis: Secondary | ICD-10-CM | POA: Diagnosis not present

## 2014-01-15 DIAGNOSIS — Z0000 Encounter for general adult medical examination without abnormal findings: Secondary | ICD-10-CM | POA: Diagnosis not present

## 2014-04-16 DIAGNOSIS — Z79899 Other long term (current) drug therapy: Secondary | ICD-10-CM | POA: Diagnosis not present

## 2014-04-16 DIAGNOSIS — D509 Iron deficiency anemia, unspecified: Secondary | ICD-10-CM | POA: Diagnosis not present

## 2014-04-16 DIAGNOSIS — I10 Essential (primary) hypertension: Secondary | ICD-10-CM | POA: Diagnosis not present

## 2014-04-16 DIAGNOSIS — Z1389 Encounter for screening for other disorder: Secondary | ICD-10-CM | POA: Diagnosis not present

## 2014-04-16 DIAGNOSIS — E780 Pure hypercholesterolemia: Secondary | ICD-10-CM | POA: Diagnosis not present

## 2014-04-16 DIAGNOSIS — R413 Other amnesia: Secondary | ICD-10-CM | POA: Diagnosis not present

## 2014-04-23 DIAGNOSIS — E538 Deficiency of other specified B group vitamins: Secondary | ICD-10-CM | POA: Diagnosis not present

## 2014-04-30 DIAGNOSIS — E538 Deficiency of other specified B group vitamins: Secondary | ICD-10-CM | POA: Diagnosis not present

## 2014-05-07 DIAGNOSIS — D510 Vitamin B12 deficiency anemia due to intrinsic factor deficiency: Secondary | ICD-10-CM | POA: Diagnosis not present

## 2014-05-17 DIAGNOSIS — D510 Vitamin B12 deficiency anemia due to intrinsic factor deficiency: Secondary | ICD-10-CM | POA: Diagnosis not present

## 2014-06-01 DIAGNOSIS — J189 Pneumonia, unspecified organism: Secondary | ICD-10-CM | POA: Diagnosis not present

## 2014-06-15 DIAGNOSIS — D510 Vitamin B12 deficiency anemia due to intrinsic factor deficiency: Secondary | ICD-10-CM | POA: Diagnosis not present

## 2014-07-07 DIAGNOSIS — H25813 Combined forms of age-related cataract, bilateral: Secondary | ICD-10-CM | POA: Diagnosis not present

## 2014-07-07 DIAGNOSIS — H40003 Preglaucoma, unspecified, bilateral: Secondary | ICD-10-CM | POA: Diagnosis not present

## 2014-07-15 DIAGNOSIS — E780 Pure hypercholesterolemia: Secondary | ICD-10-CM | POA: Diagnosis not present

## 2014-07-15 DIAGNOSIS — D510 Vitamin B12 deficiency anemia due to intrinsic factor deficiency: Secondary | ICD-10-CM | POA: Diagnosis not present

## 2014-07-15 DIAGNOSIS — R252 Cramp and spasm: Secondary | ICD-10-CM | POA: Diagnosis not present

## 2014-10-20 DIAGNOSIS — I739 Peripheral vascular disease, unspecified: Secondary | ICD-10-CM | POA: Diagnosis not present

## 2014-10-20 DIAGNOSIS — Z23 Encounter for immunization: Secondary | ICD-10-CM | POA: Diagnosis not present

## 2014-10-20 DIAGNOSIS — I10 Essential (primary) hypertension: Secondary | ICD-10-CM | POA: Diagnosis not present

## 2014-10-20 DIAGNOSIS — Z79899 Other long term (current) drug therapy: Secondary | ICD-10-CM | POA: Diagnosis not present

## 2014-10-20 DIAGNOSIS — E7800 Pure hypercholesterolemia, unspecified: Secondary | ICD-10-CM | POA: Diagnosis not present

## 2014-11-05 ENCOUNTER — Other Ambulatory Visit: Payer: Self-pay

## 2014-11-05 DIAGNOSIS — I739 Peripheral vascular disease, unspecified: Secondary | ICD-10-CM

## 2014-11-15 DIAGNOSIS — K219 Gastro-esophageal reflux disease without esophagitis: Secondary | ICD-10-CM | POA: Diagnosis not present

## 2014-11-23 ENCOUNTER — Other Ambulatory Visit: Payer: Self-pay

## 2014-11-23 DIAGNOSIS — K449 Diaphragmatic hernia without obstruction or gangrene: Secondary | ICD-10-CM | POA: Diagnosis not present

## 2014-11-23 DIAGNOSIS — R1310 Dysphagia, unspecified: Secondary | ICD-10-CM | POA: Diagnosis not present

## 2014-11-23 DIAGNOSIS — Z951 Presence of aortocoronary bypass graft: Secondary | ICD-10-CM | POA: Diagnosis not present

## 2014-11-23 DIAGNOSIS — K219 Gastro-esophageal reflux disease without esophagitis: Secondary | ICD-10-CM | POA: Diagnosis not present

## 2014-11-23 DIAGNOSIS — I10 Essential (primary) hypertension: Secondary | ICD-10-CM | POA: Diagnosis not present

## 2014-11-23 DIAGNOSIS — K2270 Barrett's esophagus without dysplasia: Secondary | ICD-10-CM | POA: Diagnosis not present

## 2014-11-23 DIAGNOSIS — Z800 Family history of malignant neoplasm of digestive organs: Secondary | ICD-10-CM | POA: Diagnosis not present

## 2014-11-23 DIAGNOSIS — Z8673 Personal history of transient ischemic attack (TIA), and cerebral infarction without residual deficits: Secondary | ICD-10-CM | POA: Diagnosis not present

## 2014-12-08 ENCOUNTER — Encounter: Payer: Self-pay | Admitting: Vascular Surgery

## 2014-12-14 ENCOUNTER — Encounter: Payer: Self-pay | Admitting: Vascular Surgery

## 2014-12-14 ENCOUNTER — Ambulatory Visit (INDEPENDENT_AMBULATORY_CARE_PROVIDER_SITE_OTHER): Payer: Medicare Other | Admitting: Vascular Surgery

## 2014-12-14 ENCOUNTER — Ambulatory Visit (HOSPITAL_COMMUNITY)
Admission: RE | Admit: 2014-12-14 | Discharge: 2014-12-14 | Disposition: A | Discharge status: Home / Self Care | Payer: Medicare Other | Source: Ambulatory Visit | Attending: Vascular Surgery | Admitting: Vascular Surgery

## 2014-12-14 VITALS — BP 134/67 | HR 50 | Temp 97.8°F | Resp 18 | Ht 63.0 in | Wt 126.5 lb

## 2014-12-14 DIAGNOSIS — R938 Abnormal findings on diagnostic imaging of other specified body structures: Secondary | ICD-10-CM | POA: Diagnosis not present

## 2014-12-14 DIAGNOSIS — I10 Essential (primary) hypertension: Secondary | ICD-10-CM | POA: Insufficient documentation

## 2014-12-14 DIAGNOSIS — I739 Peripheral vascular disease, unspecified: Secondary | ICD-10-CM | POA: Insufficient documentation

## 2014-12-14 DIAGNOSIS — E785 Hyperlipidemia, unspecified: Secondary | ICD-10-CM | POA: Diagnosis not present

## 2014-12-14 NOTE — Progress Notes (Signed)
HISTORY AND PHYSICAL     CC:  Painful legs Referring Provider:  Marylen PontoHolt, Lynley S, MD  HPI: This is a 74 y.o. female who presents today for cramping and tingling in both her legs.  She states that this can happen at night or during the day.  At night when this happens, she has to get up and walk around.  She states that it has been going on for a couple of months, but has improved over the past month.  She is using horse liniment and drinking quinine water, which seems to help.  She does have claudication symptoms where both of her legs get tired after walking and improves with rest.  The distance she is able to walk varies.   She had a CABG in 2012, which [facility].  She did have left saphenous vein harvest.   She has a hx of right CEA by Dr. Arbie CookeyEarly in 2008.  She has done well and has not had any stroke symptoms.   She has her carotids checked by her cardiologist.   She states that her medical doctor checked her for diabetes and it was negative.    She does take a beta blocker and ACEI for hypertension management.  She is on a statin for cholesterol management.  She takes a daily aspirin.  Past Medical History  Diagnosis Date  . Carotid artery occlusion   . Hypertension   . Hyperlipidemia   . Arthritis   . GERD (gastroesophageal reflux disease)   . Stroke [facility](HCC) 2005    Past Surgical History  Procedure Laterality Date  . Carotid endarterectomy    . Coronary artery bypass graft    . Abdominal hysterectomy      No Known Allergies  Current Outpatient Prescriptions  Medication Sig Dispense Refill  . aspirin 81 MG tablet Take 325 mg by mouth daily.     . calcium-vitamin D 250-100 MG-UNIT per tablet Take 1 tablet by mouth 1 day or 1 dose.    . carvedilol (COREG) 6.25 MG tablet Take 6.25 mg by mouth 2 (two) times daily with a meal.    . cyanocobalamin 1000 MCG tablet Take 1,000 mcg by mouth daily.    Marland Kitchen. esomeprazole (NEXIUM) 40 MG capsule Take 40 mg by mouth daily before  breakfast.    . Magnesium 500 MG CAPS Take 500 mg by mouth daily.    . Omega-3-acid Ethyl Esters (LOVAZA PO) Take 1,000 mg by mouth daily.    . pravastatin (PRAVACHOL) 40 MG tablet Take 40 mg by mouth daily.    . ramipril (ALTACE) 5 MG capsule Take 5 mg by mouth.    . cilostazol (PLETAL) 100 MG tablet Take 100 mg by mouth 2 (two) times daily.    . niacin 500 MG tablet Take 500 mg by mouth daily with breakfast.     No current facility-administered medications for this visit.    Family History  Problem Relation Age of Onset  . Heart disease Father     Social History   Social History  . Marital Status: Married    Spouse Name: N/A  . Number of Children: N/A  . Years of Education: N/A   Occupational History  . Not on file.   Social History Main Topics  . Smoking status: Former Smoker -- 0.50 packs/day for 20 years    Types: Cigarettes    Quit date: 03/07/1981  . Smokeless tobacco: Never Used  . Alcohol Use: No  .  Drug Use: No  . Sexual Activity: Not on file   Other Topics Concern  . Not on file   Social History Narrative     ROS:  Positive    Negative    All sytems reviewed and are negative  Cardiovascular:  chest pain/pressure  palpitations  SOB lying flat  DOE  pain in legs while walking as well as cramping in legs at night  pain in feet when lying flat  hx of DVT  hx of phlebitis  swelling in legs  varicose veins  Pulmonary:  productive cough  asthma  wheezing  Neurologic:  weakness in  arms  legs  numbness in  arms  legs difficulty speaking or slurred speech  temporary loss of vision in one eye  dizziness  Hematologic:  bleeding problems  problems with blood clotting easily  GI  vomiting blood  blood in stool  GU:  burning with urination  blood in urine  Psychiatric:  hx of major depression  Integumentary:  rashes  ulcers  Constitutional:  fever   chills   PHYSICAL EXAMINATION:  Filed Vitals:   12/14/14 1246  BP: 134/67  Pulse: 50  Temp: 97.8 F (36.6 C)  Resp: 18   Body mass index is 22.41 kg/(m^2).  General:  WDWN in NAD Gait: Not observed HENT: WNL, normocephalic Pulmonary: normal non-labored breathing , without Rales, rhonchi,  wheezing Cardiac: RRR, without  Murmurs, rubs or gallops; without carotid bruits Abdomen: soft, NT, no masses Skin: without rashes; well healed right CEA scar; well healed sternotomy scar; well healed EVH scar left leg.  Vascular Exam/Pulses:  Right Left  Radial 2+ (normal) 2+ (normal)  Femoral 2+ (normal) 1+ (weak)  Popliteal Unable to palpate  Unable to palpate   DP Unable to palpate  Unable to palpate   PT Unable to palpate  Unable to palpate    Extremities: without ischemic changes, without Gangrene , without cellulitis; without open wounds;  Musculoskeletal: no muscle wasting or atrophy  Neurologic: A&O X 3; Appropriate Affect ; SENSATION: normal; MOTOR FUNCTION:  moving all extremities equally. Speech is fluent/normal   Non-Invasive Vascular Imaging:   ABI's 12/14/14: Right: 0.57 Left:  0.55  TBI's 12/14/14: Right:  <0.69 Left:  <0.69  Pt meds includes: Statin:  Yes.   Beta Blocker:  Yes.   Aspirin:  Yes.   ACEI:  Yes.   ARB:  No. Other Antiplatelet/Anticoagulant:  Yes.   Pletal   ASSESSMENT/PLAN:: 74 y.o. female with claudication    -pt does describes symptoms of claudication and ABI's do indicate moderate occlusive disease bilaterally. -she does not have any non healing wounds and she is able to do what she needs to do daily. -Dr. Arbie Cookey discussed with her that if she develops non healing wounds or if her claudication becomes unbearable, then we can proceed with arteriogram with possible intervention. -she does have hx of right carotid endarterectomy.  She states that this is being followed with ultrasound by her cardiologist, which is fine as long as it is  being followed.  -discussed with pt that improving blood flow to her legs will most likely not improve her cramping/tingling sensations.  Continue quinine water and horse liniment for symptoms. -she will f/u with Dr. Arbie Cookey as needed.   She knows to call if she develops non healing wounds or if her claudication sx worsen.   Doreatha Massed, PA-C [facility] [phone]  Clinic MD:  Pt seen and examined in  conjunction with Dr. Arbie Cookey  I have examined the patient, reviewed and agree with above. Majority of symptoms did not appear to be related to arterial insufficiency. She does have arterial claudication but this is tolerable. She will monitor this and notify should she develop progressive symptoms  Gretta Began, MD 12/14/2014 3:07 PM

## 2014-12-20 DIAGNOSIS — K219 Gastro-esophageal reflux disease without esophagitis: Secondary | ICD-10-CM | POA: Diagnosis not present

## 2014-12-20 DIAGNOSIS — K2270 Barrett's esophagus without dysplasia: Secondary | ICD-10-CM | POA: Diagnosis not present

## 2015-01-21 DIAGNOSIS — H524 Presbyopia: Secondary | ICD-10-CM | POA: Diagnosis not present

## 2015-01-21 DIAGNOSIS — H25813 Combined forms of age-related cataract, bilateral: Secondary | ICD-10-CM | POA: Diagnosis not present

## 2015-01-28 DIAGNOSIS — B309 Viral conjunctivitis, unspecified: Secondary | ICD-10-CM | POA: Diagnosis not present

## 2015-02-16 DIAGNOSIS — I10 Essential (primary) hypertension: Secondary | ICD-10-CM | POA: Diagnosis not present

## 2015-02-16 DIAGNOSIS — I739 Peripheral vascular disease, unspecified: Secondary | ICD-10-CM | POA: Diagnosis not present

## 2015-02-16 DIAGNOSIS — E785 Hyperlipidemia, unspecified: Secondary | ICD-10-CM | POA: Diagnosis not present

## 2015-02-16 DIAGNOSIS — R413 Other amnesia: Secondary | ICD-10-CM | POA: Diagnosis not present

## 2015-02-16 DIAGNOSIS — Z79899 Other long term (current) drug therapy: Secondary | ICD-10-CM | POA: Diagnosis not present

## 2015-02-16 DIAGNOSIS — Z0000 Encounter for general adult medical examination without abnormal findings: Secondary | ICD-10-CM | POA: Diagnosis not present

## 2015-04-08 DIAGNOSIS — K529 Noninfective gastroenteritis and colitis, unspecified: Secondary | ICD-10-CM | POA: Diagnosis not present

## 2015-04-13 DIAGNOSIS — J189 Pneumonia, unspecified organism: Secondary | ICD-10-CM | POA: Diagnosis not present

## 2015-04-13 DIAGNOSIS — R062 Wheezing: Secondary | ICD-10-CM | POA: Diagnosis not present

## 2015-05-04 DIAGNOSIS — Z9181 History of falling: Secondary | ICD-10-CM | POA: Diagnosis not present

## 2015-05-04 DIAGNOSIS — Z1389 Encounter for screening for other disorder: Secondary | ICD-10-CM | POA: Diagnosis not present

## 2015-05-04 DIAGNOSIS — Z8701 Personal history of pneumonia (recurrent): Secondary | ICD-10-CM | POA: Diagnosis not present

## 2015-07-08 DIAGNOSIS — E785 Hyperlipidemia, unspecified: Secondary | ICD-10-CM | POA: Insufficient documentation

## 2015-07-11 DIAGNOSIS — Z6821 Body mass index (BMI) 21.0-21.9, adult: Secondary | ICD-10-CM | POA: Diagnosis not present

## 2015-07-11 DIAGNOSIS — I25119 Atherosclerotic heart disease of native coronary artery with unspecified angina pectoris: Secondary | ICD-10-CM | POA: Diagnosis not present

## 2015-07-11 DIAGNOSIS — I739 Peripheral vascular disease, unspecified: Secondary | ICD-10-CM | POA: Diagnosis not present

## 2015-07-11 DIAGNOSIS — E782 Mixed hyperlipidemia: Secondary | ICD-10-CM | POA: Diagnosis not present

## 2015-07-11 DIAGNOSIS — Z8673 Personal history of transient ischemic attack (TIA), and cerebral infarction without residual deficits: Secondary | ICD-10-CM | POA: Diagnosis not present

## 2015-07-11 DIAGNOSIS — I10 Essential (primary) hypertension: Secondary | ICD-10-CM | POA: Diagnosis not present

## 2015-07-11 DIAGNOSIS — E119 Type 2 diabetes mellitus without complications: Secondary | ICD-10-CM | POA: Diagnosis not present

## 2015-07-13 DIAGNOSIS — I739 Peripheral vascular disease, unspecified: Secondary | ICD-10-CM | POA: Diagnosis not present

## 2015-07-13 DIAGNOSIS — I10 Essential (primary) hypertension: Secondary | ICD-10-CM | POA: Diagnosis not present

## 2015-07-13 DIAGNOSIS — E119 Type 2 diabetes mellitus without complications: Secondary | ICD-10-CM | POA: Diagnosis not present

## 2015-07-13 DIAGNOSIS — I25119 Atherosclerotic heart disease of native coronary artery with unspecified angina pectoris: Secondary | ICD-10-CM | POA: Diagnosis not present

## 2015-07-13 DIAGNOSIS — Z8673 Personal history of transient ischemic attack (TIA), and cerebral infarction without residual deficits: Secondary | ICD-10-CM | POA: Diagnosis not present

## 2015-07-13 DIAGNOSIS — E782 Mixed hyperlipidemia: Secondary | ICD-10-CM | POA: Diagnosis not present

## 2015-09-02 ENCOUNTER — Other Ambulatory Visit: Payer: Self-pay

## 2015-10-11 DIAGNOSIS — Z79899 Other long term (current) drug therapy: Secondary | ICD-10-CM | POA: Diagnosis not present

## 2015-10-11 DIAGNOSIS — Z23 Encounter for immunization: Secondary | ICD-10-CM | POA: Diagnosis not present

## 2015-10-11 DIAGNOSIS — E785 Hyperlipidemia, unspecified: Secondary | ICD-10-CM | POA: Diagnosis not present

## 2015-10-11 DIAGNOSIS — G2581 Restless legs syndrome: Secondary | ICD-10-CM | POA: Diagnosis not present

## 2015-10-11 DIAGNOSIS — I10 Essential (primary) hypertension: Secondary | ICD-10-CM | POA: Diagnosis not present

## 2015-11-16 DIAGNOSIS — K2270 Barrett's esophagus without dysplasia: Secondary | ICD-10-CM | POA: Diagnosis not present

## 2015-11-16 DIAGNOSIS — K219 Gastro-esophageal reflux disease without esophagitis: Secondary | ICD-10-CM | POA: Diagnosis not present

## 2015-12-28 DIAGNOSIS — K219 Gastro-esophageal reflux disease without esophagitis: Secondary | ICD-10-CM | POA: Diagnosis not present

## 2015-12-28 DIAGNOSIS — K2270 Barrett's esophagus without dysplasia: Secondary | ICD-10-CM | POA: Diagnosis not present

## 2015-12-28 DIAGNOSIS — K644 Residual hemorrhoidal skin tags: Secondary | ICD-10-CM | POA: Diagnosis not present

## 2016-01-07 DIAGNOSIS — J209 Acute bronchitis, unspecified: Secondary | ICD-10-CM | POA: Diagnosis not present

## 2016-01-07 DIAGNOSIS — R062 Wheezing: Secondary | ICD-10-CM | POA: Diagnosis not present

## 2016-01-12 DIAGNOSIS — J45901 Unspecified asthma with (acute) exacerbation: Secondary | ICD-10-CM | POA: Diagnosis not present

## 2016-01-18 DIAGNOSIS — I2510 Atherosclerotic heart disease of native coronary artery without angina pectoris: Secondary | ICD-10-CM | POA: Diagnosis not present

## 2016-01-18 DIAGNOSIS — I639 Cerebral infarction, unspecified: Secondary | ICD-10-CM | POA: Diagnosis not present

## 2016-01-18 DIAGNOSIS — R410 Disorientation, unspecified: Secondary | ICD-10-CM | POA: Diagnosis not present

## 2016-01-18 DIAGNOSIS — I10 Essential (primary) hypertension: Secondary | ICD-10-CM | POA: Diagnosis not present

## 2016-01-18 DIAGNOSIS — Z8673 Personal history of transient ischemic attack (TIA), and cerebral infarction without residual deficits: Secondary | ICD-10-CM | POA: Diagnosis not present

## 2016-01-18 DIAGNOSIS — Z7982 Long term (current) use of aspirin: Secondary | ICD-10-CM | POA: Diagnosis not present

## 2016-01-18 DIAGNOSIS — Z951 Presence of aortocoronary bypass graft: Secondary | ICD-10-CM | POA: Diagnosis not present

## 2016-01-18 DIAGNOSIS — N390 Urinary tract infection, site not specified: Secondary | ICD-10-CM | POA: Diagnosis not present

## 2016-01-18 DIAGNOSIS — Z79899 Other long term (current) drug therapy: Secondary | ICD-10-CM | POA: Diagnosis not present

## 2016-01-30 DIAGNOSIS — H25813 Combined forms of age-related cataract, bilateral: Secondary | ICD-10-CM | POA: Diagnosis not present

## 2016-02-14 DIAGNOSIS — M84371A Stress fracture, right ankle, initial encounter for fracture: Secondary | ICD-10-CM | POA: Diagnosis not present

## 2016-02-15 DIAGNOSIS — S8264XA Nondisplaced fracture of lateral malleolus of right fibula, initial encounter for closed fracture: Secondary | ICD-10-CM | POA: Diagnosis not present

## 2016-02-22 DIAGNOSIS — S8264XA Nondisplaced fracture of lateral malleolus of right fibula, initial encounter for closed fracture: Secondary | ICD-10-CM | POA: Diagnosis not present

## 2016-02-29 DIAGNOSIS — S8264XD Nondisplaced fracture of lateral malleolus of right fibula, subsequent encounter for closed fracture with routine healing: Secondary | ICD-10-CM | POA: Diagnosis not present

## 2016-03-27 DIAGNOSIS — S8264XD Nondisplaced fracture of lateral malleolus of right fibula, subsequent encounter for closed fracture with routine healing: Secondary | ICD-10-CM | POA: Diagnosis not present

## 2016-04-18 DIAGNOSIS — Z780 Asymptomatic menopausal state: Secondary | ICD-10-CM | POA: Diagnosis not present

## 2016-04-18 DIAGNOSIS — S8264XD Nondisplaced fracture of lateral malleolus of right fibula, subsequent encounter for closed fracture with routine healing: Secondary | ICD-10-CM | POA: Diagnosis not present

## 2016-05-07 DIAGNOSIS — M810 Age-related osteoporosis without current pathological fracture: Secondary | ICD-10-CM | POA: Diagnosis not present

## 2016-05-07 DIAGNOSIS — Z780 Asymptomatic menopausal state: Secondary | ICD-10-CM | POA: Diagnosis not present

## 2016-06-14 DIAGNOSIS — N390 Urinary tract infection, site not specified: Secondary | ICD-10-CM | POA: Diagnosis not present

## 2016-06-14 DIAGNOSIS — R531 Weakness: Secondary | ICD-10-CM | POA: Diagnosis not present

## 2016-06-14 DIAGNOSIS — I10 Essential (primary) hypertension: Secondary | ICD-10-CM | POA: Diagnosis not present

## 2016-06-14 DIAGNOSIS — Z8673 Personal history of transient ischemic attack (TIA), and cerebral infarction without residual deficits: Secondary | ICD-10-CM | POA: Diagnosis not present

## 2016-06-14 DIAGNOSIS — R404 Transient alteration of awareness: Secondary | ICD-10-CM | POA: Diagnosis not present

## 2016-06-14 DIAGNOSIS — Z951 Presence of aortocoronary bypass graft: Secondary | ICD-10-CM | POA: Diagnosis not present

## 2016-06-14 DIAGNOSIS — N179 Acute kidney failure, unspecified: Secondary | ICD-10-CM | POA: Diagnosis not present

## 2016-06-14 DIAGNOSIS — R079 Chest pain, unspecified: Secondary | ICD-10-CM | POA: Diagnosis not present

## 2016-06-14 DIAGNOSIS — M1990 Unspecified osteoarthritis, unspecified site: Secondary | ICD-10-CM | POA: Diagnosis not present

## 2016-06-14 DIAGNOSIS — R55 Syncope and collapse: Secondary | ICD-10-CM | POA: Diagnosis not present

## 2016-06-14 DIAGNOSIS — I951 Orthostatic hypotension: Secondary | ICD-10-CM | POA: Diagnosis not present

## 2016-06-15 DIAGNOSIS — I951 Orthostatic hypotension: Secondary | ICD-10-CM | POA: Diagnosis not present

## 2016-06-15 DIAGNOSIS — N390 Urinary tract infection, site not specified: Secondary | ICD-10-CM | POA: Diagnosis not present

## 2016-06-15 DIAGNOSIS — N179 Acute kidney failure, unspecified: Secondary | ICD-10-CM | POA: Diagnosis not present

## 2016-06-15 DIAGNOSIS — R55 Syncope and collapse: Secondary | ICD-10-CM | POA: Diagnosis not present

## 2016-06-25 DIAGNOSIS — R55 Syncope and collapse: Secondary | ICD-10-CM | POA: Diagnosis not present

## 2016-06-25 DIAGNOSIS — Z6821 Body mass index (BMI) 21.0-21.9, adult: Secondary | ICD-10-CM | POA: Diagnosis not present

## 2016-06-25 DIAGNOSIS — Z0000 Encounter for general adult medical examination without abnormal findings: Secondary | ICD-10-CM | POA: Diagnosis not present

## 2016-07-05 ENCOUNTER — Encounter: Payer: Self-pay | Admitting: Cardiology

## 2016-07-13 DIAGNOSIS — R55 Syncope and collapse: Secondary | ICD-10-CM | POA: Diagnosis not present

## 2016-07-24 ENCOUNTER — Encounter: Payer: Self-pay | Admitting: Cardiology

## 2016-07-24 ENCOUNTER — Ambulatory Visit (INDEPENDENT_AMBULATORY_CARE_PROVIDER_SITE_OTHER): Payer: Medicare Other | Admitting: Cardiology

## 2016-07-24 DIAGNOSIS — I209 Angina pectoris, unspecified: Secondary | ICD-10-CM | POA: Insufficient documentation

## 2016-07-24 DIAGNOSIS — I25119 Atherosclerotic heart disease of native coronary artery with unspecified angina pectoris: Secondary | ICD-10-CM | POA: Insufficient documentation

## 2016-07-24 DIAGNOSIS — I10 Essential (primary) hypertension: Secondary | ICD-10-CM | POA: Diagnosis not present

## 2016-07-24 DIAGNOSIS — E782 Mixed hyperlipidemia: Secondary | ICD-10-CM | POA: Insufficient documentation

## 2016-07-24 DIAGNOSIS — R55 Syncope and collapse: Secondary | ICD-10-CM | POA: Insufficient documentation

## 2016-07-24 DIAGNOSIS — I739 Peripheral vascular disease, unspecified: Secondary | ICD-10-CM | POA: Insufficient documentation

## 2016-07-24 DIAGNOSIS — I2510 Atherosclerotic heart disease of native coronary artery without angina pectoris: Secondary | ICD-10-CM

## 2016-07-24 DIAGNOSIS — Z951 Presence of aortocoronary bypass graft: Secondary | ICD-10-CM | POA: Diagnosis not present

## 2016-07-24 NOTE — Addendum Note (Signed)
Addended by: Lamona CurlOUTH, Marwa Fuhrman H on: 07/24/2016 04:27 PM   Modules accepted: Orders

## 2016-07-24 NOTE — Patient Instructions (Signed)
Medication Instructions:  Your physician recommends that you continue on your current medications as directed. Please refer to the Current Medication list given to you today.   Labwork: None  Testing/Procedures: Your physician has requested that you have a lexiscan myoview. For further information please visit [email or web address]. Please follow instruction sheet, as given.  You had an EKG today   Follow-Up: Your physician recommends that you schedule a follow-up appointment in:1 month  Any Other Special Instructions Will Be Listed Below (If Applicable).     If you need a refill on your cardiac medications before your next appointment, please call your pharmacy.

## 2016-07-24 NOTE — Progress Notes (Signed)
Cardiology Office Note:    Date:  07/24/2016   ID:  Susan [name], DOB 1940/09/29, MRN [id number]  PCP:  Marylen Ponto, MD  Cardiologist:  Garwin Brothers, MD   Referring MD: Marylen Ponto, MD    ASSESSMENT:    1. Coronary artery disease involving native coronary artery of native heart without angina pectoris   2. Hx of CABG   3. Essential hypertension   4. Mixed dyslipidemia   5. Angina pectoris (HCC)   6. Intermittent claudication (HCC)   7. Syncope, unspecified syncope type    PLAN:    In order of problems listed above:  1. Secondary prevention stressed to the patient. Importance of compliance with diet and medications stressed and she vocalized understanding. EKG done today at my office reveals sinus bradycardia and nonspecific ST-T changes. She also complains of chest tightness at times and we will do a Lexiscan sestamibi to assess her symptoms. This will also help [place] assess ischemia as a possible cause for her arrhythmias. She is on a beta blocker therapy and I will continue in view of the arrhythmia seen on the Holter monitor. 2. Her blood pressure stable and we will continue to monitor this. 3. Lipids are followed by her primary care physician. 4. I will await the Holter monitor report and reviewed, primary care physician. Patient will be seen in follow-up appointment in one month or earlier if she has any concerns. For any significant concerns.   Medication Adjustments/Labs and Tests Ordered: Current medicines are reviewed at length with the patient today.  Concerns regarding medicines are outlined above.  Orders Placed This Encounter  Procedures  . EKG 12-Lead   No orders of the defined types were placed in this encounter.    History of Present Illness:    Susan [name] is a 76 y.o. female who is being seen today for the evaluation of Syncope at the request of Marylen Ponto, MD. Patient is a pleasant 76 year old female. She was initially evaluated and taken care  of by me at the previous practice. She is now here to be established and is seeking care here under me. She was referred here for consultation I have primary care physician. Patient had a syncopal event and was sent to [facility] where she underwent evaluation. Subsequently a Holter monitor was done. I do not have the report I talked to the primary care physician about this and she mentioned that the patient had episodes of sinus tachycardia and SVT at times. She also had nonsustained ventricular tachycardia and therefore she was sent here for an evaluation. At the time of my evaluation she is alert awake oriented and in no distress. She's not had any suggestions in the past several days. She is here for evaluation for this problem.  Past Medical History:  Diagnosis Date  . Arthritis   . Carotid artery occlusion   . GERD (gastroesophageal reflux disease)   . Hyperlipidemia   . Hypertension   . Stroke (HCC) 2005  . Syncope     Past Surgical History:  Procedure Laterality Date  . ABDOMINAL HYSTERECTOMY    . CAROTID ENDARTERECTOMY    . CORONARY ARTERY BYPASS GRAFT      Current Medications: Current Meds  Medication Sig  . aspirin 81 MG tablet Take 325 mg by mouth daily.   . calcium-vitamin D 250-100 MG-UNIT per tablet Take 1 tablet by mouth 1 day or 1 dose.  . carvedilol (COREG) 6.25 MG tablet Take  6.25 mg by mouth 2 (two) times daily with a meal.  . cilostazol (PLETAL) 100 MG tablet Take 100 mg by mouth 2 (two) times daily.  . cyanocobalamin 1000 MCG tablet Take 1,000 mcg by mouth daily.  Marland Kitchen. esomeprazole (NEXIUM) 40 MG capsule Take 40 mg by mouth daily before breakfast.  . Magnesium 500 MG CAPS Take 500 mg by mouth daily.  . Omega-3-acid Ethyl Esters (LOVAZA PO) Take 1,000 mg by mouth daily.  . pravastatin (PRAVACHOL) 40 MG tablet Take 40 mg by mouth daily.  . ramipril (ALTACE) 5 MG capsule Take 5 mg by mouth.     Allergies:   Boniva [ibandronic acid]; Crestor [rosuvastatin  calcium]; Fenofibrate; Vytorin [ezetimibe-simvastatin]; Welchol [colesevelam hcl]; and Zetia [ezetimibe]   Social History   Social History  . Marital status: Married    Spouse name: N/A  . Number of children: N/A  . Years of education: N/A   Social History Main Topics  . Smoking status: Former Smoker    Packs/day: 0.50    Years: 20.00    Types: Cigarettes    Quit date: 03/07/1981  . Smokeless tobacco: Never Used  . Alcohol use No  . Drug use: No  . Sexual activity: Not Asked   Other Topics Concern  . None   Social History Narrative  . None     Family History: The patient's family history includes Heart attack in her father; Heart disease in her father.  ROS:   Please see the history of present illness.    All other systems reviewed and are negative.  EKGs/Labs/Other Studies Reviewed:    The following studies were reviewed today: I reviewed records from [facility]. I spoke to her primary care physician about her evaluation and Holter monitor report length and awaiting the Holter monitor report at this time.   Recent Labs: No results found for requested labs within last 8760 hours.  Recent Lipid Panel No results found for: CHOL, TRIG, HDL, CHOLHDL, VLDL, LDLCALC, LDLDIRECT  Physical Exam:    VS:  BP (!) 164/68   Pulse (!) 55   Ht 5' 3.5" (1.613 m)   Wt 119 lb (54 kg)   SpO2 96%   BMI 20.75 kg/m     Wt Readings from Last 3 Encounters:  07/24/16 119 lb (54 kg)  12/14/14 126 lb 8 oz (57.4 kg)  04/29/12 133 lb (60.3 kg)     GEN: Patient is in no acute distress HEENT: Normal NECK: No JVD; No carotid bruits LYMPHATICS: No lymphadenopathy CARDIAC: S1 S2 regular, 2/6 systolic murmur at the apex. RESPIRATORY:  Clear to auscultation without rales, wheezing or rhonchi  ABDOMEN: Soft, non-tender, non-distended MUSCULOSKELETAL:  No edema; No deformity  SKIN: Warm and dry NEUROLOGIC:  Alert and oriented x 3 PSYCHIATRIC:  Normal affect    Signed, Garwin Brothersajan  R Hasel Janish, MD  07/24/2016 4:11 PM    [facility]

## 2016-07-26 ENCOUNTER — Telehealth (HOSPITAL_COMMUNITY): Payer: Self-pay | Admitting: *Deleted

## 2016-07-26 NOTE — Telephone Encounter (Signed)
Patient given detailed instructions per Myocardial Perfusion Study Information Sheet for the test on  07/30/16. Patient notified to arrive 15 minutes early and that it is imperative to arrive on time for appointment to keep from having the test rescheduled.  If you need to cancel or reschedule your appointment, please call the office within 24 hours of your appointment. . Patient verbalized understanding.Susan [name] Susan [name]    

## 2016-07-30 ENCOUNTER — Encounter: Payer: Self-pay | Admitting: Cardiology

## 2016-07-30 ENCOUNTER — Ambulatory Visit (INDEPENDENT_AMBULATORY_CARE_PROVIDER_SITE_OTHER): Payer: Medicare Other | Admitting: Cardiology

## 2016-07-30 ENCOUNTER — Ambulatory Visit (HOSPITAL_COMMUNITY): Payer: Medicare Other | Attending: Internal Medicine

## 2016-07-30 ENCOUNTER — Telehealth: Payer: Self-pay

## 2016-07-30 VITALS — BP 140/60 | HR 60 | Ht 63.0 in | Wt 122.8 lb

## 2016-07-30 DIAGNOSIS — I209 Angina pectoris, unspecified: Secondary | ICD-10-CM

## 2016-07-30 DIAGNOSIS — R9439 Abnormal result of other cardiovascular function study: Secondary | ICD-10-CM | POA: Diagnosis not present

## 2016-07-30 DIAGNOSIS — I25119 Atherosclerotic heart disease of native coronary artery with unspecified angina pectoris: Secondary | ICD-10-CM

## 2016-07-30 DIAGNOSIS — I10 Essential (primary) hypertension: Secondary | ICD-10-CM | POA: Diagnosis not present

## 2016-07-30 DIAGNOSIS — Z951 Presence of aortocoronary bypass graft: Secondary | ICD-10-CM | POA: Diagnosis not present

## 2016-07-30 DIAGNOSIS — I739 Peripheral vascular disease, unspecified: Secondary | ICD-10-CM | POA: Diagnosis not present

## 2016-07-30 DIAGNOSIS — Z8673 Personal history of transient ischemic attack (TIA), and cerebral infarction without residual deficits: Secondary | ICD-10-CM | POA: Diagnosis not present

## 2016-07-30 DIAGNOSIS — R079 Chest pain, unspecified: Secondary | ICD-10-CM | POA: Diagnosis not present

## 2016-07-30 DIAGNOSIS — E782 Mixed hyperlipidemia: Secondary | ICD-10-CM | POA: Diagnosis not present

## 2016-07-30 DIAGNOSIS — R0609 Other forms of dyspnea: Secondary | ICD-10-CM | POA: Insufficient documentation

## 2016-07-30 DIAGNOSIS — I2510 Atherosclerotic heart disease of native coronary artery without angina pectoris: Secondary | ICD-10-CM | POA: Diagnosis not present

## 2016-07-30 DIAGNOSIS — R55 Syncope and collapse: Secondary | ICD-10-CM | POA: Diagnosis not present

## 2016-07-30 LAB — CBC
Hematocrit: 38.7 % (ref 34.0–46.6)
Hemoglobin: 13 g/dL (ref 11.1–15.9)
MCH: 30.2 pg (ref 26.6–33.0)
MCHC: 33.6 g/dL (ref 31.5–35.7)
MCV: 90 fL (ref 79–97)
PLATELETS: 290 10*3/uL (ref 150–379)
RBC: 4.3 x10E6/uL (ref 3.77–5.28)
RDW: 13.3 % (ref 12.3–15.4)
WBC: 9.6 10*3/uL (ref 3.4–10.8)

## 2016-07-30 LAB — MYOCARDIAL PERFUSION IMAGING
CHL CUP NUCLEAR SDS: 10
CHL CUP NUCLEAR SRS: 12
CHL CUP NUCLEAR SSS: 22
CHL CUP RESTING HR STRESS: 53 {beats}/min
CSEPPHR: 112 {beats}/min
LV dias vol: 109 mL (ref 46–106)
LV sys vol: 59 mL
RATE: 0.35
TID: 1.23

## 2016-07-30 LAB — BASIC METABOLIC PANEL
BUN / CREAT RATIO: 15 (ref 12–28)
BUN: 16 mg/dL (ref 8–27)
CALCIUM: 9.4 mg/dL (ref 8.7–10.3)
CHLORIDE: 104 mmol/L (ref 96–106)
CO2: 23 mmol/L (ref 20–29)
Creatinine, Ser: 1.05 mg/dL — ABNORMAL HIGH (ref 0.57–1.00)
GFR, EST AFRICAN AMERICAN: 60 mL/min/{1.73_m2} (ref >59)
GFR, EST NON AFRICAN AMERICAN: 52 mL/min/{1.73_m2} — AB (ref >59)
Glucose: 78 mg/dL (ref 65–99)
POTASSIUM: 4.2 mmol/L (ref 3.5–5.2)
SODIUM: 145 mmol/L — AB (ref 134–144)

## 2016-07-30 LAB — PROTIME-INR
INR: 0.9 (ref 0.8–1.2)
Prothrombin Time: 10.1 s (ref 9.1–12.0)

## 2016-07-30 MED ORDER — REGADENOSON 0.4 MG/5ML IV SOLN
0.4000 mg | Freq: Once | INTRAVENOUS | Status: AC
  Administered 2016-07-30: 0.4 mg via INTRAVENOUS

## 2016-07-30 MED ORDER — TECHNETIUM TC 99M TETROFOSMIN IV KIT
31.4000 | PACK | Freq: Once | INTRAVENOUS | Status: AC | PRN
  Administered 2016-07-30: 31.4 via INTRAVENOUS
  Filled 2016-07-30: qty 32

## 2016-07-30 MED ORDER — TECHNETIUM TC 99M TETROFOSMIN IV KIT
10.2000 | PACK | Freq: Once | INTRAVENOUS | Status: AC | PRN
  Administered 2016-07-30: 10.2 via INTRAVENOUS
  Filled 2016-07-30: qty 11

## 2016-07-30 NOTE — Patient Instructions (Signed)
Medication Instructions:  Your physician recommends that you continue on your current medications as directed. Please refer to the Current Medication list given to you today.   Labwork: Your physician recommends that you return for lab work today for CBC, BMET, PT/INR  Testing/Procedures:    [facility] [address] [facility] OFF[facility] [address], [address] [place] [address] Dept: [phone] Loc: [phone]  Susan CalamityJudy [name]  07/30/2016  You are scheduled for a left heart cath on Thursday August 2nd.   1. [facility] at [facility]: [address] [place], [place] [address] at 11:30 for a 1:30 procedure time.  (two hours before your procedure to ensure your preparation). Free valet parking service is available.   Special note: Every effort is made to have your procedure done on time. Please understand that emergencies sometimes delay scheduled procedures.  2. Diet: nothing to eat or drink after midnight the night before  3. Labs: Today. CBC, BMET, PT/INR  5. Plan for one night stay--bring personal belongings. 6. Bring a current list of your medications and current insurance cards. 7. You MUST have a responsible person to drive you home. 8. Someone MUST be with you the first 24 hours after you arrive home or your discharge will be delayed. 9. Please wear clothes that are easy to get on and off and wear slip-on shoes.  Thank you for allowing us to care for you!   -- [facility]

## 2016-07-30 NOTE — Telephone Encounter (Signed)
Called patient to advise her that per Brittainy Simmon patient is to hold on taking aspirin the morning of her procedure. They will give her some that morning in the hospital.

## 2016-07-30 NOTE — Progress Notes (Addendum)
07/30/2016 Susan [name]   11-01-1940  [id number]  Primary Physician Marylen PontoHolt, Lynley S, MD Primary Cardiologist: Dr. Tomie Chinaevankar    Reason for Visit/CC: Abnormal Stress Test   HPI:  Susan CalamityJudy [name] is a 76 y.o. female who is being seen today for the evaluation of abnormal nuclear stress test.   She has a h/o CAB[place] 2012[place], HTN, HLD and h/o CVA. She is followed by Dr. Josiah Loboevenkar.  She was recently seen by him in clinic last week on 07/24/16. She noted recent recurrent CP and syncope. Prior to this visit, she was seen by her PCP for syncope and a holter monitor was ordered and she was noted to have SVT and NSVT. Subsequently, Dr. Tomie Chinaevankar ordered a Lexiscan NST. This was peformed in our office today and noted to be abnormal with marked ST depressions in leads II and III during stress as well as slight depressions in leads V5 and V6. Nuclear images also c/w perfussion defects concerning for ischemia. Findings were sent to DOD for notification and patient added on to schedule to discuss LHC. Dr. Johney FrameAllred, DOD, discussed with Dr. Tomie Chinaevankar over the phone and he has recommended definitive LHC.   She is here with her husband and is currently CP free. We discussed indication for cath as well as potential risk and patient agrees to proceed.   Current Meds  Medication Sig  . aspirin 81 MG tablet Take 325 mg by mouth daily.   . calcium-vitamin D 250-100 MG-UNIT per tablet Take 1 tablet by mouth 1 day or 1 dose.  . carvedilol (COREG) 6.25 MG tablet Take 6.25 mg by mouth 2 (two) times daily with a meal.  . cilostazol (PLETAL) 100 MG tablet Take 100 mg by mouth 2 (two) times daily.  . cyanocobalamin 1000 MCG tablet Take 1,000 mcg by mouth daily.  Marland Kitchen. esomeprazole (NEXIUM) 40 MG capsule Take 40 mg by mouth daily at 12 noon.  . Magnesium 500 MG CAPS Take 500 mg by mouth daily.  . niacin 500 MG tablet Take 500 mg by mouth daily with breakfast.  . Omega-3-acid Ethyl Esters (LOVAZA PO) Take 1,000 mg  by mouth daily.  . pravastatin (PRAVACHOL) 40 MG tablet Take 40 mg by mouth daily.  . ramipril (ALTACE) 5 MG capsule Take 5 mg by mouth daily.    Allergies  Allergen Reactions  . Boniva [Ibandronic Acid] Shortness Of Breath  . Crestor [Rosuvastatin Calcium]     Weakness   . Fenofibrate     Muscle pain  . Vytorin [Ezetimibe-Simvastatin]     Weakness  . Welchol [Colesevelam Hcl]     Weakness  . Zetia [Ezetimibe]     Weakness    Past Medical History:  Diagnosis Date  . Arthritis   . Carotid artery occlusion   . GERD (gastroesophageal reflux disease)   . Hyperlipidemia   . Hypertension   . Stroke (HCC) 2005  . Syncope    Family History  Problem Relation Age of Onset  . Heart disease Father   . Heart attack Father    Past Surgical History:  Procedure Laterality Date  . ABDOMINAL HYSTERECTOMY    . CAROTID ENDARTERECTOMY    . CORONARY ARTERY BYPASS GRAFT     Social History   Social History  . Marital status: Married    Spouse name: N/A  . Number of children: N/A  . Years of education: N/A   Occupational History  . Not on file.   Social  History Main Topics  . Smoking status: Former Smoker    Packs/day: 0.50    Years: 20.00    Types: Cigarettes    Quit date: 03/07/1981  . Smokeless tobacco: Never Used  . Alcohol use No  . Drug use: No  . Sexual activity: Not on file   Other Topics Concern  . Not on file   Social History Narrative  . No narrative on file     Review of Systems: General: negative for chills, fever, night sweats or weight changes.  Cardiovascular: negative for chest pain, dyspnea on exertion, edema, orthopnea, palpitations, paroxysmal nocturnal dyspnea or shortness of breath Dermatological: negative for rash Respiratory: negative for cough or wheezing Urologic: negative for hematuria Abdominal: negative for nausea, vomiting, diarrhea, bright red blood per rectum, melena, or hematemesis Neurologic: negative for visual changes, syncope, or  dizziness All other systems reviewed and are otherwise negative except as noted above.   Physical Exam:  Blood pressure (!) 150/60, pulse 60, height 5' 3" (1.6 m), weight 122 lb 12.8 oz (55.7 kg), SpO2 97 %.  General appearance: alert, cooperative and no distress Neck: no carotid bruit and no JVD Lungs: clear to auscultation bilaterally Heart: regular rate and rhythm, S1, S2 normal, no murmur, click, rub or gallop Extremities: extremities normal, atraumatic, no cyanosis or edema Pulses: 2+ and symmetric Skin: Skin color, texture, turgor normal. No rashes or lesions Neurologic: Grossly normal  EKG ST depressions during stress portion of test in leads II, III, V5 and V6 that normalized by test completion -- personally reviewed   ASSESSMENT AND PLAN:   1. CAD/ Abnormal NST: h/o CABG in 2012 in [place]. Pt has not had a repeat cath since then. She has had recent symptoms of recurrent CP and syncope (several weeks ago) with Holter monitor showing NSVT. NST earlier today abnormal with ST depressions in II, III, V5 and V6 during stress portion of test, which normalized at the end of test completion. Nuclear images also with perfusion defects suggestive of coronary ischemia. She is currently CP free and no recurrent symptoms since her PCP set her up with a Holter monitor. Pt also notes it was felt that her syncope was likely 2/2 dehydration and her arrhthymias was an incidental finding. Her BP is stable and she is on a BB. We discussed indication for LHC and reviewed potential risk including but not limited to death, MI, stroke, bleeding complications, vascular injury, allergic reaction to contrast, nephrotoxicity. She understands these risk and agrees to proceed. We will arrange [facility] at [facility]. Pt was given Rx for SL NTG and instructed to call 911 if recurrent CP, unrelieved with NTG. Pt and her husband both verbalized understanding.     Dalonda Simoni PA-C, [facility] 07/30/2016 12:35  PM  

## 2016-07-31 ENCOUNTER — Telehealth: Payer: Self-pay

## 2016-07-31 NOTE — Telephone Encounter (Signed)
[facility] scheduled for:  08/02/2016 @ 1400 Verified arrival time and place:  NT @ 1200 Confirmed AM meds to be taken pre-cath with sip of water:  Notified to take ASA prior to arrival Confirmed patient has responsible person to drive home post procedure and observe patient for 24 hours:  husband Addl concerns:  None noted

## 2016-08-02 ENCOUNTER — Encounter (HOSPITAL_COMMUNITY)
Admission: RE | Disposition: A | Discharge status: Home / Self Care | Payer: Self-pay | Source: Ambulatory Visit | Attending: Internal Medicine

## 2016-08-02 ENCOUNTER — Other Ambulatory Visit: Payer: Self-pay

## 2016-08-02 ENCOUNTER — Ambulatory Visit (HOSPITAL_COMMUNITY)
Admission: RE | Admit: 2016-08-02 | Discharge: 2016-08-02 | Disposition: A | Discharge status: Home / Self Care | Payer: Medicare Other | Source: Ambulatory Visit | Attending: Internal Medicine | Admitting: Internal Medicine

## 2016-08-02 DIAGNOSIS — Z87891 Personal history of nicotine dependence: Secondary | ICD-10-CM | POA: Insufficient documentation

## 2016-08-02 DIAGNOSIS — I2583 Coronary atherosclerosis due to lipid rich plaque: Secondary | ICD-10-CM | POA: Insufficient documentation

## 2016-08-02 DIAGNOSIS — Z8673 Personal history of transient ischemic attack (TIA), and cerebral infarction without residual deficits: Secondary | ICD-10-CM | POA: Diagnosis not present

## 2016-08-02 DIAGNOSIS — K219 Gastro-esophageal reflux disease without esophagitis: Secondary | ICD-10-CM | POA: Diagnosis not present

## 2016-08-02 DIAGNOSIS — R9439 Abnormal result of other cardiovascular function study: Secondary | ICD-10-CM

## 2016-08-02 DIAGNOSIS — I10 Essential (primary) hypertension: Secondary | ICD-10-CM | POA: Insufficient documentation

## 2016-08-02 DIAGNOSIS — Z7982 Long term (current) use of aspirin: Secondary | ICD-10-CM | POA: Insufficient documentation

## 2016-08-02 DIAGNOSIS — E785 Hyperlipidemia, unspecified: Secondary | ICD-10-CM | POA: Insufficient documentation

## 2016-08-02 DIAGNOSIS — I2510 Atherosclerotic heart disease of native coronary artery without angina pectoris: Secondary | ICD-10-CM | POA: Insufficient documentation

## 2016-08-02 DIAGNOSIS — M1990 Unspecified osteoarthritis, unspecified site: Secondary | ICD-10-CM | POA: Insufficient documentation

## 2016-08-02 DIAGNOSIS — Z951 Presence of aortocoronary bypass graft: Secondary | ICD-10-CM | POA: Diagnosis not present

## 2016-08-02 HISTORY — PX: LEFT HEART CATH AND CORS/GRAFTS ANGIOGRAPHY: CATH118250

## 2016-08-02 SURGERY — LEFT HEART CATH AND CORS/GRAFTS ANGIOGRAPHY
Anesthesia: LOCAL

## 2016-08-02 MED ORDER — SODIUM CHLORIDE 0.9% FLUSH: 3.0000 mL | Freq: Two times a day (BID) | INTRAVENOUS | Status: DC

## 2016-08-02 MED ORDER — ASPIRIN 81 MG PO CHEW: 81.0000 mg | CHEWABLE_TABLET | ORAL | Status: DC

## 2016-08-02 MED ORDER — HEPARIN (PORCINE) IN NACL 2-0.9 UNIT/ML-% IJ SOLN
INTRAMUSCULAR | Status: AC | PRN
  Administered 2016-08-02: 500 mL

## 2016-08-02 MED ORDER — IOPAMIDOL (ISOVUE-370) INJECTION 76%
INTRAVENOUS | Status: AC
  Filled 2016-08-02: qty 100

## 2016-08-02 MED ORDER — HEPARIN (PORCINE) IN NACL 2-0.9 UNIT/ML-% IJ SOLN
INTRAMUSCULAR | Status: AC
  Filled 2016-08-02: qty 1500

## 2016-08-02 MED ORDER — FENTANYL CITRATE (PF) 100 MCG/2ML IJ SOLN
INTRAMUSCULAR | Status: DC | PRN
  Administered 2016-08-02: 25 ug via INTRAVENOUS

## 2016-08-02 MED ORDER — SODIUM CHLORIDE 0.9 % IV SOLN: 250.0000 mL | INTRAVENOUS | Status: DC | PRN

## 2016-08-02 MED ORDER — HYDRALAZINE HCL 20 MG/ML IJ SOLN: 10.0000 mg | INTRAMUSCULAR | Status: DC | PRN

## 2016-08-02 MED ORDER — LIDOCAINE HCL 1 % IJ SOLN
INTRAMUSCULAR | Status: AC
  Filled 2016-08-02: qty 20

## 2016-08-02 MED ORDER — ONDANSETRON HCL 4 MG/2ML IJ SOLN: 4.0000 mg | Freq: Four times a day (QID) | INTRAMUSCULAR | Status: DC | PRN

## 2016-08-02 MED ORDER — IOPAMIDOL (ISOVUE-370) INJECTION 76%
INTRAVENOUS | Status: DC | PRN
  Administered 2016-08-02: 125 mL via INTRA_ARTERIAL

## 2016-08-02 MED ORDER — SODIUM CHLORIDE 0.9 % WEIGHT BASED INFUSION
1.0000 mL/kg/h | INTRAVENOUS | Status: DC
  Administered 2016-08-02: 1 mL/kg/h via INTRAVENOUS

## 2016-08-02 MED ORDER — LIDOCAINE HCL (PF) 1 % IJ SOLN
INTRAMUSCULAR | Status: DC | PRN
  Administered 2016-08-02: 30 mL

## 2016-08-02 MED ORDER — ACETAMINOPHEN 325 MG PO TABS: 650.0000 mg | ORAL_TABLET | ORAL | Status: DC | PRN

## 2016-08-02 MED ORDER — SODIUM CHLORIDE 0.9% FLUSH: 3.0000 mL | INTRAVENOUS | Status: DC | PRN

## 2016-08-02 MED ORDER — IOPAMIDOL (ISOVUE-370) INJECTION 76%
INTRAVENOUS | Status: AC
  Filled 2016-08-02: qty 50

## 2016-08-02 MED ORDER — MIDAZOLAM HCL 2 MG/2ML IJ SOLN
INTRAMUSCULAR | Status: DC | PRN
  Administered 2016-08-02: 1 mg via INTRAVENOUS

## 2016-08-02 MED ORDER — SODIUM CHLORIDE 0.9 % WEIGHT BASED INFUSION: 3.0000 mL/kg/h | INTRAVENOUS | Status: AC

## 2016-08-02 MED ORDER — MIDAZOLAM HCL 2 MG/2ML IJ SOLN
INTRAMUSCULAR | Status: AC
  Filled 2016-08-02: qty 2

## 2016-08-02 MED ORDER — ASPIRIN 81 MG PO CHEW: 81.0000 mg | CHEWABLE_TABLET | Freq: Every day | ORAL | Status: DC

## 2016-08-02 MED ORDER — FENTANYL CITRATE (PF) 100 MCG/2ML IJ SOLN
INTRAMUSCULAR | Status: AC
  Filled 2016-08-02: qty 2

## 2016-08-02 MED ORDER — ISOSORBIDE MONONITRATE ER 30 MG PO TB24: 30.0000 mg | ORAL_TABLET | Freq: Every day | ORAL | 0 refills | Status: DC

## 2016-08-02 MED ORDER — MORPHINE SULFATE (PF) 4 MG/ML IV SOLN: 2.0000 mg | INTRAVENOUS | Status: DC | PRN

## 2016-08-02 MED ORDER — SODIUM CHLORIDE 0.9 % IV SOLN: INTRAVENOUS | Status: AC

## 2016-08-02 SURGICAL SUPPLY — 7 items
CATH INFINITI MULTIPACK ST 5F (CATHETERS) ×1 IMPLANT
KIT HEART LEFT (KITS) ×2 IMPLANT
PACK CARDIAC CATHETERIZATION (CUSTOM PROCEDURE TRAY) ×2 IMPLANT
SHEATH PINNACLE 5F 10CM (SHEATH) ×1 IMPLANT
SYR MEDRAD MARK V 150ML (SYRINGE) ×2 IMPLANT
TRANSDUCER W/STOPCOCK (MISCELLANEOUS) ×2 IMPLANT
TUBING CIL FLEX 10 FLL-RA (TUBING) ×2 IMPLANT

## 2016-08-02 NOTE — Progress Notes (Signed)
Site area: rt groin fa sheath Site Prior to Removal:  Level 0 Pressure Applied For: 20 minutes Manual:   yes Patient Status During Pull:  stable Post Pull Site:  Level 0 Post Pull Instructions Given:  yes Post Pull Pulses Present: palpable Dressing Applied:  Gauze and tegaderm Bedrest begins @ 1530 Comments:

## 2016-08-02 NOTE — H&P (View-Only) (Signed)
07/30/2016 Darel HongJudy Wale   11-01-1940  [id number]  Primary Physician Marylen PontoHolt, Lynley S, MD Primary Cardiologist: Dr. Tomie Chinaevankar    Reason for Visit/CC: Abnormal Stress Test   HPI:  Nilda CalamityJudy Lick is a 76 y.o. female who is being seen today for the evaluation of abnormal nuclear stress test.   She has a h/[place] CABG in 2012[place], HTN, HLD and h/o CVA. She is followed by Dr. Josiah Loboevenkar.  She was recently seen by him in clinic last week on 07/24/16. She noted recent recurrent CP and syncope. Prior to this visit, she was seen by her PCP for syncope and a holter monitor was ordered and she was noted to have SVT and NSVT. Subsequently, Dr. Tomie Chinaevankar ordered a Lexiscan NST. This was peformed in our office today and noted to be abnormal with marked ST depressions in leads II and III during stress as well as slight depressions in leads V5 and V6. Nuclear images also c/w perfussion defects concerning for ischemia. Findings were sent to DOD for notification and patient added on to schedule to discuss LHC. Dr. Johney FrameAllred, DOD, discussed with Dr. Tomie Chinaevankar over the phone and he has recommended definitive LHC.   She is here with her husband and is currently CP free. We discussed indication for cath as well as potential risk and patient agrees to proceed.   Current Meds  Medication Sig  . aspirin 81 MG tablet Take 325 mg by mouth daily.   . calcium-vitamin D 250-100 MG-UNIT per tablet Take 1 tablet by mouth 1 day or 1 dose.  . carvedilol (COREG) 6.25 MG tablet Take 6.25 mg by mouth 2 (two) times daily with a meal.  . cilostazol (PLETAL) 100 MG tablet Take 100 mg by mouth 2 (two) times daily.  . cyanocobalamin 1000 MCG tablet Take 1,000 mcg by mouth daily.  Marland Kitchen. esomeprazole (NEXIUM) 40 MG capsule Take 40 mg by mouth daily at 12 noon.  . Magnesium 500 MG CAPS Take 500 mg by mouth daily.  . niacin 500 MG tablet Take 500 mg by mouth daily with breakfast.  . Omega-3-acid Ethyl Esters (LOVAZA PO) Take 1,000 mg  by mouth daily.  . pravastatin (PRAVACHOL) 40 MG tablet Take 40 mg by mouth daily.  . ramipril (ALTACE) 5 MG capsule Take 5 mg by mouth daily.    Allergies  Allergen Reactions  . Boniva [Ibandronic Acid] Shortness Of Breath  . Crestor [Rosuvastatin Calcium]     Weakness   . Fenofibrate     Muscle pain  . Vytorin [Ezetimibe-Simvastatin]     Weakness  . Welchol [Colesevelam Hcl]     Weakness  . Zetia [Ezetimibe]     Weakness    Past Medical History:  Diagnosis Date  . Arthritis   . Carotid artery occlusion   . GERD (gastroesophageal reflux disease)   . Hyperlipidemia   . Hypertension   . Stroke (HCC) 2005  . Syncope    Family History  Problem Relation Age of Onset  . Heart disease Father   . Heart attack Father    Past Surgical History:  Procedure Laterality Date  . ABDOMINAL HYSTERECTOMY    . CAROTID ENDARTERECTOMY    . CORONARY ARTERY BYPASS GRAFT     Social History   Social History  . Marital status: Married    Spouse name: N/A  . Number of children: N/A  . Years of education: N/A   Occupational History  . Not on file.   Social  History Main Topics  . Smoking status: Former Smoker    Packs/day: 0.50    Years: 20.00    Types: Cigarettes    Quit date: 03/07/1981  . Smokeless tobacco: Never Used  . Alcohol use No  . Drug use: No  . Sexual activity: Not on file   Other Topics Concern  . Not on file   Social History Narrative  . No narrative on file     Review of Systems: General: negative for chills, fever, night sweats or weight changes.  Cardiovascular: negative for chest pain, dyspnea on exertion, edema, orthopnea, palpitations, paroxysmal nocturnal dyspnea or shortness of breath Dermatological: negative for rash Respiratory: negative for cough or wheezing Urologic: negative for hematuria Abdominal: negative for nausea, vomiting, diarrhea, bright red blood per rectum, melena, or hematemesis Neurologic: negative for visual changes, syncope, or  dizziness All other systems reviewed and are otherwise negative except as noted above.   Physical Exam:  Blood pressure (!) 150/60, pulse 60, height 5\' 3"  (1.6 m), weight 122 lb 12.8 oz (55.7 kg), SpO2 97 %.  General appearance: alert, cooperative and no distress Neck: no carotid bruit and no JVD Lungs: clear to auscultation bilaterally Heart: regular rate and rhythm, S1, S2 normal, no murmur, click, rub or gallop Extremities: extremities normal, atraumatic, no cyanosis or edema Pulses: 2+ and symmetric Skin: Skin color, texture, turgor normal. No rashes or lesions Neurologic: Grossly normal  EKG ST depressions during stress portion of test in leads II, III, V5 and V6 that normalized by test completion -- personally reviewed   ASSESSMENT AND PLAN:   1. CAD/ Abnormal NST: [place]. Pt has not had a repeat cath since then. She has had recent symptoms of recurrent CP and syncope (several weeks ago) with Holter monitor showing NSVT. NST earlier today abnormal with ST depressions in II, III, V5 and V6 during stress portion of test, which normalized at the end of test completion. Nuclear images also with perfusion defects suggestive of coronary ischemia. She is currently CP free and no recurrent symptoms since her PCP set her up with a Holter monitor. Pt also notes it was felt that her syncope was likely 2/2 dehydration and her arrhthymias was an incidental finding. Her BP is stable and she is on a BB. We discussed indication for LHC and reviewed potential risk including but not limited to death, MI, stroke, bleeding complications, vascular injury, allergic reaction to contrast, nephrotoxicity. She understands these risk and agrees to proceed. [facility] Pt was given Rx for SL NTG and instructed to call 911 if recurrent CP, unrelieved with NTG. Pt and her husband both verbalized understanding.     Brittainy Delmer IslamSimmons PA-C, [facility] 07/30/2016 12:35  PM

## 2016-08-02 NOTE — Discharge Instructions (Signed)
Angiogram, Care After °This sheet gives you information about how to care for yourself after your procedure. Your health care provider may also give you more specific instructions. If you have problems or questions, contact your health care provider. °What can I expect after the procedure? °After the procedure, it is common to have bruising and tenderness at the catheter insertion area. °Follow these instructions at home: °Insertion site care  °· Follow instructions from your health care provider about how to take care of your insertion site. Make sure you: °¨ Wash your hands with soap and water before you change your bandage (dressing). If soap and water are not available, use hand sanitizer. °¨ Change your dressing as told by your health care provider. °¨ Leave stitches (sutures), skin glue, or adhesive strips in place. These skin closures may need to stay in place for 2 weeks or longer. If adhesive strip edges start to loosen and curl up, you may trim the loose edges. Do not remove adhesive strips completely unless your health care provider tells you to do that. °· Do not take baths, swim, or use a hot tub until your health care provider approves. °· You may shower 24-48 hours after the procedure or as told by your health care provider. °¨ Gently wash the site with plain soap and water. °¨ Pat the area dry with a clean towel. °¨ Do not rub the site. This may cause bleeding. °· Do not apply powder or lotion to the site. Keep the site clean and dry. °· Check your insertion site every day for signs of infection. Check for: °¨ Redness, swelling, or pain. °¨ Fluid or blood. °¨ Warmth. °¨ Pus or a bad smell. °Activity  °· Rest as told by your health care provider, usually for 1-2 days. °· Do not lift anything that is heavier than 10 lbs. (4.5 kg) or as told by your health care provider. °· Do not drive for 24 hours if you were given a medicine to help you relax (sedative). °· Do not drive or use heavy machinery while  taking prescription pain medicine. °General instructions  °· Return to your normal activities as told by your health care provider, usually in about a week. Ask your health care provider what activities are safe for you. °· If the catheter site starts bleeding, lie flat and put pressure on the site. If the bleeding does not stop, get help right away. This is a medical emergency. °· Drink enough fluid to keep your urine clear or pale yellow. This helps flush the contrast dye from your body. °· Take over-the-counter and prescription medicines only as told by your health care provider. °· Keep all follow-up visits as told by your health care provider. This is important. °Contact a health care provider if: °· You have a fever or chills. °· You have redness, swelling, or pain around your insertion site. °· You have fluid or blood coming from your insertion site. °· The insertion site feels warm to the touch. °· You have pus or a bad smell coming from your insertion site. °· You have bruising around the insertion site. °· You notice blood collecting in the tissue around the catheter site (hematoma). The hematoma may be painful to the touch. °Get help right away if: °· You have severe pain at the catheter insertion area. °· The catheter insertion area swells very fast. °· The catheter insertion area is bleeding, and the bleeding does not stop when you hold steady pressure on   the area. °· The area near or just beyond the catheter insertion site becomes pale, cool, tingly, or numb. °These symptoms may represent a serious problem that is an emergency. Do not wait to see if the symptoms will go away. Get medical help right away. Call your local emergency services (911 in the [place] Do not drive yourself to the hospital. °Summary °· After the procedure, it is common to have bruising and tenderness at the catheter insertion area. °· After the procedure, it is important to rest and drink plenty of fluids. °· Do not take baths,  swim, or use a hot tub until your health care provider says it is okay to do so. You may shower 24-48 hours after the procedure or as told by your health care provider. °· If the catheter site starts bleeding, lie flat and put pressure on the site. If the bleeding does not stop, get help right away. This is a medical emergency. °This information is not intended to replace advice given to you by your health care provider. Make sure you discuss any questions you have with your health care provider. °Document Released: 07/06/2004 Document Revised: 11/23/2015 Document Reviewed: 11/23/2015 °Elsevier Interactive Patient Education © 2017 [employer] ° °

## 2016-08-02 NOTE — Interval H&P Note (Signed)
Cath Lab Visit (complete for each Cath Lab visit)  Clinical Evaluation Leading to the Procedure:   ACS: Yes.    Non-ACS:    Anginal Classification: CCS III  Anti-ischemic medical therapy: Minimal Therapy (1 class of medications)  Non-Invasive Test Results: High-risk stress test findings: cardiac mortality >3%/year  Prior CABG: Previous CABG      History and Physical Interval Note:  08/02/2016 2:21 PM  Lonna Schofield  has presented today for surgery, with the diagnosis of cad  The various methods of treatment have been discussed with the patient and family. After consideration of risks, benefits and other options for treatment, the patient has consented to  Procedure(s): Left Heart Cath and Cors/Grafts Angiography (N/A) as a surgical intervention .  The patient's history has been reviewed, patient examined, no change in status, stable for surgery.  I have reviewed the patient's chart and labs.  Questions were answered to the patient's satisfaction.     Nanetta BattyBerry, Henson Fraticelli

## 2016-08-02 NOTE — Progress Notes (Signed)
Dr. Allyson SabalBerry requested f/u for this patient and rx of Imdur 30mg  daily - have sent in. Was unable to get through to scheduler by phone so I have sent a message to our office's scheduler requesting a follow-up appointment, and our office will call the patient with this information. Kemet Nijjar PA-C

## 2016-08-03 ENCOUNTER — Encounter (HOSPITAL_COMMUNITY): Payer: Self-pay | Admitting: Cardiovascular Disease

## 2016-08-14 ENCOUNTER — Encounter: Payer: Self-pay | Admitting: Cardiology

## 2016-08-14 ENCOUNTER — Ambulatory Visit (INDEPENDENT_AMBULATORY_CARE_PROVIDER_SITE_OTHER): Payer: Medicare Other | Admitting: Cardiology

## 2016-08-14 VITALS — BP 142/80 | HR 54 | Ht 63.0 in | Wt 120.0 lb

## 2016-08-14 DIAGNOSIS — Z951 Presence of aortocoronary bypass graft: Secondary | ICD-10-CM

## 2016-08-14 DIAGNOSIS — E782 Mixed hyperlipidemia: Secondary | ICD-10-CM

## 2016-08-14 DIAGNOSIS — I2510 Atherosclerotic heart disease of native coronary artery without angina pectoris: Secondary | ICD-10-CM | POA: Diagnosis not present

## 2016-08-14 DIAGNOSIS — I209 Angina pectoris, unspecified: Secondary | ICD-10-CM

## 2016-08-14 DIAGNOSIS — I10 Essential (primary) hypertension: Secondary | ICD-10-CM | POA: Diagnosis not present

## 2016-08-14 MED ORDER — ISOSORBIDE MONONITRATE ER 30 MG PO TB24: 30.0000 mg | ORAL_TABLET | Freq: Every day | ORAL | 6 refills | Status: DC

## 2016-08-14 MED ORDER — RANOLAZINE ER 1000 MG PO TB12: 1000.0000 mg | ORAL_TABLET | Freq: Two times a day (BID) | ORAL | 6 refills | Status: DC

## 2016-08-14 NOTE — Addendum Note (Signed)
Addended by: Rodney LangtonITTER, JADA M on: 08/14/2016 11:58 AM   Modules accepted: Orders

## 2016-08-14 NOTE — Progress Notes (Signed)
Cardiology Office Note:    Date:  08/14/2016   ID:  Susan [name], DOB 1940/12/27, MRN [id number]  PCP:  Marylen Ponto, MD  Cardiologist:  Garwin Brothers, MD   Referring MD: Marylen Ponto, MD    ASSESSMENT:    1. Coronary artery disease involving native coronary artery of native heart without angina pectoris   2. Essential hypertension   3. Angina pectoris (HCC)   4. Hx of CABG   5. Mixed dyslipidemia    PLAN:    In order of problems listed above:  1. Secondary prevention stressed to the patient. Importance of compliance with diet and medications stressed. Coronary angiography report was discussed with the patient at extensive length and she verbalized understanding. Questions were answered to her satisfaction. I have asked her to continue with isosorbide at this time. She was encouraged to begin an exercise program and she is agreeable to this. I will also initiate her on Ranexa 1000 mg half tablet twice a day for 2 weeks and then one tablet twice daily. She will see me next in 4 months. Diet was also discussed with dyslipidemia and this will be followed by her primary care physician.   Medication Adjustments/Labs and Tests Ordered: Current medicines are reviewed at length with the patient today.  Concerns regarding medicines are outlined above.  No orders of the defined types were placed in this encounter.  No orders of the defined types were placed in this encounter.    Chief Complaint  Patient presents with  . Follow-up     History of Present Illness:    Susan [name] is a 76 y.o. female . The patient has coronary artery disease post CABG surgery. She was evaluated by me in view of significant angina-like symptoms and underwent coronary angiography. This revealed that the patient had multiple areas of stenosis but because of the anatomy of the vasculature shows recommended continued medical therapy. Patient has been initiated on intercourse 30 mg daily and tells me that  she is feeling better. No chest pain orthopnea or PND now. She is beginning an exercise program. At the time of my evaluation she is alert awake oriented and in no distress. Her husband accompanies her.  Past Medical History:  Diagnosis Date  . Arthritis   . Carotid artery occlusion   . GERD (gastroesophageal reflux disease)   . Hyperlipidemia   . Hypertension   . Stroke (HCC) 2005  . Syncope     Past Surgical History:  Procedure Laterality Date  . ABDOMINAL HYSTERECTOMY    . CAROTID ENDARTERECTOMY    . CORONARY ARTERY BYPASS GRAFT    . LEFT HEART CATH AND CORS/GRAFTS ANGIOGRAPHY N/A 08/02/2016   Procedure: Left Heart Cath and Cors/Grafts Angiography;  Surgeon: Runell Gess, MD;  [facility] CV LAB;  Service: Cardiovascular;  Laterality: N/A;    Current Medications: Current Meds  Medication Sig  . acetaminophen (TYLENOL) 500 MG tablet Take 1,000 mg by mouth every 6 (six) hours as needed (for pain.).  Marland Kitchen aspirin 81 MG tablet Take 81 mg by mouth daily.   . calcium-vitamin D 250-100 MG-UNIT per tablet Take 1 tablet by mouth daily.   . carvedilol (COREG) 6.25 MG tablet Take 6.25 mg by mouth 2 (two) times daily with a meal.  . cyanocobalamin 1000 MCG tablet Take 1,000 mcg by mouth daily.  Marland Kitchen esomeprazole (NEXIUM) 40 MG capsule Take 40 mg by mouth daily before breakfast.   . isosorbide mononitrate (IMDUR)  30 MG 24 hr tablet Take 1 tablet (30 mg total) by mouth daily.  . Omega-3-acid Ethyl Esters (LOVAZA PO) Take 1,000 mg by mouth daily.  Marland Kitchen. omeprazole (PRILOSEC) 40 MG capsule Take 30 mg by mouth daily.  . pravastatin (PRAVACHOL) 40 MG tablet Take 40 mg by mouth daily.  . ramipril (ALTACE) 5 MG capsule Take 5 mg by mouth daily.      Allergies:   Boniva [ibandronic acid]; Crestor [rosuvastatin calcium]; Fenofibrate; Vytorin [ezetimibe-simvastatin]; Welchol [colesevelam hcl]; and Zetia [ezetimibe]   Social History   Social History  . Marital status: Married    Spouse  name: N/A  . Number of children: N/A  . Years of education: N/A   Social History Main Topics  . Smoking status: Former Smoker    Packs/day: 0.50    Years: 20.00    Types: Cigarettes    Quit date: 03/07/1981  . Smokeless tobacco: Never Used  . Alcohol use No  . Drug use: No  . Sexual activity: Not Asked   Other Topics Concern  . None   Social History Narrative  . None     Family History: The patient's family history includes Heart attack in her father; Heart disease in her father.  ROS:   Please see the history of present illness.    All other systems reviewed and are negative.  EKGs/Labs/Other Studies Reviewed:    The following studies were reviewed today: I reviewed hospital records including coronary angiography report at extensive length and discussed with the patient and she vocalized understanding and questions were answered to their satisfaction.   Recent Labs: 07/30/2016: BUN 16; Creatinine, Ser 1.05; Hemoglobin 13.0; Platelets 290; Potassium 4.2; Sodium 145  Recent Lipid Panel No results found for: CHOL, TRIG, HDL, CHOLHDL, VLDL, LDLCALC, LDLDIRECT  Physical Exam:    VS:  BP (!) 142/80   Pulse (!) 54   Ht 5\' 3"  (1.6 m)   Wt 120 lb 0.6 oz (54.4 kg)   SpO2 98%   BMI 21.26 kg/m     Wt Readings from Last 3 Encounters:  08/14/16 120 lb 0.6 oz (54.4 kg)  08/02/16 120 lb (54.4 kg)  07/30/16 122 lb 12.8 oz (55.7 kg)     GEN: Patient is in no acute distress HEENT: Normal NECK: No JVD; No carotid bruits LYMPHATICS: No lymphadenopathy CARDIAC: Hear sounds regular, 2/6 systolic murmur at the apex. RESPIRATORY:  Clear to auscultation without rales, wheezing or rhonchi  ABDOMEN: Soft, non-tender, non-distended MUSCULOSKELETAL:  No edema; No deformity  SKIN: Warm and dry NEUROLOGIC:  Alert and oriented x 3 PSYCHIATRIC:  Normal affect   Signed, Garwin Brothersajan R Sade Hollon, MD  08/14/2016 11:38 AM    [facility]

## 2016-08-14 NOTE — Patient Instructions (Signed)
Medication Instructions:  Your physician has recommended you make the following change in your medication:  1.) START Ranexa 1,000 mg twice daily. For the first 2 weeks, please break the tablet in half and take 1/2 tablet twice daily which is a total of 500 mg twice daily.    Labwork: None   Testing/Procedures: None   Follow-Up: Your physician recommends that you schedule a follow-up appointment in: 4 months   Any Other Special Instructions Will Be Listed Below (If Applicable).  Please note that any paperwork needing to be filled out by the provider will need to be addressed at the front desk prior to seeing the provider. Please note that any paperwork FMLA, Disability or other documents regarding health condition is subject to a $25.00 charge that must be received prior to completion of paperwork.    If you need a refill on your cardiac medications before your next appointment, please call your pharmacy.

## 2016-08-14 NOTE — Addendum Note (Signed)
Addended by: Rodney LangtonITTER, Mozell Haber M on: 08/14/2016 11:51 AM   Modules accepted: Orders

## 2016-08-24 ENCOUNTER — Ambulatory Visit: Payer: Medicare Other | Admitting: Cardiology

## 2016-09-07 DIAGNOSIS — R0902 Hypoxemia: Secondary | ICD-10-CM | POA: Diagnosis not present

## 2016-09-07 DIAGNOSIS — R55 Syncope and collapse: Secondary | ICD-10-CM | POA: Diagnosis not present

## 2016-09-12 DIAGNOSIS — Z6822 Body mass index (BMI) 22.0-22.9, adult: Secondary | ICD-10-CM | POA: Diagnosis not present

## 2016-09-12 DIAGNOSIS — R413 Other amnesia: Secondary | ICD-10-CM | POA: Diagnosis not present

## 2016-09-12 DIAGNOSIS — J40 Bronchitis, not specified as acute or chronic: Secondary | ICD-10-CM | POA: Diagnosis not present

## 2016-09-12 DIAGNOSIS — R55 Syncope and collapse: Secondary | ICD-10-CM | POA: Diagnosis not present

## 2016-09-20 ENCOUNTER — Ambulatory Visit (INDEPENDENT_AMBULATORY_CARE_PROVIDER_SITE_OTHER): Payer: Medicare Other | Admitting: Neurology

## 2016-09-20 ENCOUNTER — Encounter (INDEPENDENT_AMBULATORY_CARE_PROVIDER_SITE_OTHER): Payer: Self-pay

## 2016-09-20 ENCOUNTER — Encounter: Payer: Self-pay | Admitting: Neurology

## 2016-09-20 VITALS — BP 161/63 | HR 57 | Ht 63.0 in | Wt 119.5 lb

## 2016-09-20 DIAGNOSIS — IMO0002 Reserved for concepts with insufficient information to code with codable children: Secondary | ICD-10-CM | POA: Insufficient documentation

## 2016-09-20 DIAGNOSIS — R413 Other amnesia: Secondary | ICD-10-CM | POA: Diagnosis not present

## 2016-09-20 DIAGNOSIS — I6529 Occlusion and stenosis of unspecified carotid artery: Secondary | ICD-10-CM | POA: Diagnosis not present

## 2016-09-20 DIAGNOSIS — R55 Syncope and collapse: Secondary | ICD-10-CM

## 2016-09-20 DIAGNOSIS — Z951 Presence of aortocoronary bypass graft: Secondary | ICD-10-CM | POA: Diagnosis not present

## 2016-09-20 HISTORY — DX: Occlusion and stenosis of unspecified carotid artery: I65.29

## 2016-09-20 MED ORDER — LEVETIRACETAM 500 MG PO TABS: 500.0000 mg | ORAL_TABLET | Freq: Two times a day (BID) | ORAL | 11 refills | Status: DC

## 2016-09-20 NOTE — Progress Notes (Signed)
PATIENT: Susan [name] DOB: 10-07-1940  Chief Complaint  Patient presents with  . Passing out events    She is here with her husband, Susan [name].  Reports passing out on four different occasions.  She felt nauseated prior to one event and it was reported to her that she had a blank stare prior to another episode.  She has lost control of her bowel and bladder three of the four times this has occurred.       HISTORICAL  Susan [name] 76 year old female, accompanied by her husband Susan [name], seen in refer by Dr. Leonor [name], Susan [name], for evaluation of passing out episode,Initial evaluation was on September 20 2016  I have reviewed and summarized referring note, she reported a history of stroke, carotid artery occlusion, but could not elaborate on detail, she did reported a history of endarterectomy in the past, she also has history of hypertension, hyperlipidemia, coronary artery disease, most recent cardiac catheter in August 2018 showed multi vessels significant stenosis, saphenous graft was patent, ejection fraction was 55-65%, there was no wall region motion abnormality,  She is on aspirin 81 mg daily, she presented with 4 episode of passing out since June 2018,  First episode, she was sitting across the table with her husband, without warning signs, she slumped over, had transient loss of consciousness,  Another episode in July 2018 with her granddaughter Susan [name], sudden onset loss of consciousness, with loss of bowel and bladder control,  Most recent episode of passing out was on September 16 2016, she was sitting on the couch talking with her friend, she felt dizzy, had a few cough, then had sudden loss of consciousness, she was found by her husband days into space, unresponsive for a few minutes, she denies heart palpitation before symptoms onset  I was able to review cardiac monitoring result from June 25 to July 09 2016 by Dr. Tomie [name], minimum heart rate of 41, maximum was 158,  One run of  ventricular tachycardia, with maximum heart rate of 130 bpm, episode of supraventricular tachycardia    REVIEW OF SYSTEMS: Full 14 system review of systems performed and notable only for feeling cold, diarrhea  ALLERGIES: Allergies  Allergen Reactions  . Boniva [Ibandronic Acid] Shortness Of Breath  . Crestor [Rosuvastatin Calcium]     Weakness   . Fenofibrate     Muscle pain  . Vytorin [Ezetimibe-Simvastatin]     Weakness  . Welchol [Colesevelam Hcl]     Weakness  . Zetia [Ezetimibe]     Weakness     HOME MEDICATIONS: Current Outpatient Prescriptions  Medication Sig Dispense Refill  . acetaminophen (TYLENOL) 500 MG tablet Take 1,000 mg by mouth every 6 (six) hours as needed (for pain.).    Marland Kitchen aspirin 81 MG tablet Take 81 mg by mouth daily.     . calcium-vitamin D 250-100 MG-UNIT per tablet Take 1 tablet by mouth daily.     . carvedilol (COREG) 6.25 MG tablet Take 6.25 mg by mouth 2 (two) times daily with a meal.    . cyanocobalamin 1000 MCG tablet Take 1,000 mcg by mouth daily.    Marland Kitchen esomeprazole (NEXIUM) 40 MG capsule Take 40 mg by mouth daily before breakfast.     . isosorbide mononitrate (IMDUR) 30 MG 24 hr tablet Take 1 tablet (30 mg total) by mouth daily. 30 tablet 6  . Omega-3-acid Ethyl Esters (LOVAZA PO) Take 1,000 mg by mouth daily.    Marland Kitchen omeprazole (PRILOSEC) 40 MG capsule Take 30  mg by mouth daily.  1  . pravastatin (PRAVACHOL) 40 MG tablet Take 40 mg by mouth daily.    . ramipril (ALTACE) 5 MG capsule Take 5 mg by mouth daily.     . ranolazine (RANEXA) 1000 MG SR tablet Take 1 tablet (1,000 mg total) by mouth 2 (two) times daily. For 2 weeks take 1/2 tablet (500 mg) twice daily. 60 tablet 6   No current facility-administered medications for this visit.     PAST MEDICAL HISTORY: Past Medical History:  Diagnosis Date  . Arthritis   . Carotid artery occlusion   . GERD (gastroesophageal reflux disease)   . Hyperlipidemia   . Hypertension   . Stroke (HCC) 2005    . Syncope     PAST SURGICAL HISTORY: Past Surgical History:  Procedure Laterality Date  . ABDOMINAL HYSTERECTOMY    . CAROTID ENDARTERECTOMY    . CORONARY ARTERY BYPASS GRAFT    . LEFT HEART CATH AND CORS/GRAFTS ANGIOGRAPHY N/A 08/02/2016   Procedure: Left Heart Cath and Cors/Grafts Angiography;  Surgeon: Susan Gess, MD;  Location: [facility] INVASIVE CV LAB;  Service: Cardiovascular;  Laterality: N/A;    FAMILY HISTORY: Family History  Problem Relation Age of Onset  . Heart attack Mother   . Heart disease Father   . Heart attack Father     SOCIAL HISTORY:  Social History   Social History  . Marital status: Married    Spouse name: N/A  . Number of children: 1  . Years of education: N/A   Occupational History  . Retired    Social History Main Topics  . Smoking status: Former Smoker    Packs/day: 0.50    Years: 20.00    Types: Cigarettes    Quit date: 03/07/1981  . Smokeless tobacco: Never Used  . Alcohol use No  . Drug use: No  . Sexual activity: Not on file   Other Topics Concern  . Not on file   Social History Narrative  . No narrative on file     PHYSICAL EXAM   Vitals:   09/20/16 1519  BP: (!) 161/63  Pulse: (!) 57  Weight: 119 lb 8 oz (54.2 kg)  Height:  (1.6 m)    Not recorded    Blood pressure lying down 161/63 heart rate of 57, sitting up 146/70 heart rate of 50, standing 160/74 heart rate of 54, standing for 3 minutes 146/69 heart rate of 54,  Body mass index is 21.17 kg/m.  PHYSICAL EXAMNIATION:  Gen: NAD, conversant, well nourised, obese, well groomed                     Cardiovascular: Regular rate rhythm, no peripheral edema, warm, nontender. Eyes: Conjunctivae clear without exudates or hemorrhage Neck: Supple, no carotid bruits. Pulmonary: Clear to auscultation bilaterally   NEUROLOGICAL EXAM:  MENTAL STATUS: Speech:    Speech is normal; fluent and spontaneous with normal comprehension.  Cognition:     Orientation to  time, place and person     Normal recent and remote memory     Normal Attention span and concentration     Normal Language, naming, repeating,spontaneous speech     Fund of knowledge   CRANIAL NERVES: CN II: Visual fields are full to confrontation. Fundoscopic exam is normal with sharp discs and no vascular changes. Pupils are round equal and briskly reactive to light. CN III, IV, VI: extraocular movement are normal. No ptosis. CN V: Facial  sensation is intact to pinprick in all 3 divisions bilaterally. Corneal responses are intact.  CN VII: Face is symmetric with normal eye closure and smile. CN VIII: Hearing is normal to rubbing fingers CN IX, X: Palate elevates symmetrically. Phonation is normal. CN XI: Head turning and shoulder shrug are intact CN XII: Tongue is midline with normal movements and no atrophy.  MOTOR: There is no pronator drift of out-stretched arms. Muscle bulk and tone are normal. Muscle strength is normal.  REFLEXES: Reflexes are 2+ and symmetric at the biceps, triceps, knees, and ankles. Plantar responses are flexor.  SENSORY: Intact to light touch, pinprick, positional sensation and vibratory sensation are intact in fingers and toes.  COORDINATION: Rapid alternating movements and fine finger movements are intact. There is no dysmetria on finger-to-nose and heel-knee-shin.    GAIT/STANCE: Posture is normal. Gait is steady with normal steps, base, arm swing, and turning. Heel and toe walking are normal. Tandem gait is normal.  Romberg is absent.   DIAGNOSTIC DATA (LABS, IMAGING, TESTING) - I reviewed patient records, labs, notes, testing and imaging myself where available.   ASSESSMENT AND PLAN  Susan [name] is a 76 y.o. female   Sudden onset loss of consciousness,  There was described staring spells, bowel bladder incontinence, differentiation diagnosis includes syncope, versus seizure,  Imperatively treated with Keppra 250 mg twice a day  Complete  evaluation with MRI of the brain  EEG  No driving until seizure free for 6 months History of carotid arteries stenosis, and stroke  MRA of the brain and neck  Arrhythmia  Cardiac monitoring July 2018 showed underlying sinus rhythm, with intermittent episode of ventricular tachycardia, supraventricular tachycardia, some of the supraventricular tachycardia or possible atrial tachycardia with variable block, ventricular trigeminy was present  Levert Feinstein, M.D. Ph.D.  [facility] [address] [place], [place] [address] Ph: [phone] Fax: [phone]  CC: Marylen Ponto, MD

## 2016-10-02 ENCOUNTER — Ambulatory Visit (INDEPENDENT_AMBULATORY_CARE_PROVIDER_SITE_OTHER): Payer: Medicare Other

## 2016-10-02 DIAGNOSIS — R4020 Unspecified coma: Secondary | ICD-10-CM | POA: Diagnosis not present

## 2016-10-02 DIAGNOSIS — I6529 Occlusion and stenosis of unspecified carotid artery: Secondary | ICD-10-CM

## 2016-10-02 DIAGNOSIS — IMO0002 Reserved for concepts with insufficient information to code with codable children: Secondary | ICD-10-CM

## 2016-10-02 DIAGNOSIS — R413 Other amnesia: Secondary | ICD-10-CM

## 2016-10-02 DIAGNOSIS — Z951 Presence of aortocoronary bypass graft: Secondary | ICD-10-CM

## 2016-10-04 ENCOUNTER — Telehealth: Payer: Self-pay | Admitting: Neurology

## 2016-10-04 NOTE — Telephone Encounter (Signed)
Pt's husband request EEG results. He is aware it can take up to 4 days for results

## 2016-10-05 ENCOUNTER — Telehealth: Payer: Self-pay

## 2016-10-05 NOTE — Telephone Encounter (Signed)
I left a detailed message with normal EEG results, ok per dpr. I advised patient to call back with any questions or concerns.   

## 2016-10-05 NOTE — Telephone Encounter (Signed)
See telephone encounter from 10/05/16. I left a detailed message with normal EEG results, ok per DPR.

## 2016-10-05 NOTE — Procedures (Signed)
   HISTORY: 76 year old female passing out episode   TECHNIQUE:  16 channel EEG was performed based on standard 10-16 international system. One channel was dedicated to EKG, which has demonstrates slow sinus rhythm of 48 beats per minutes.  Upon awakening, the posterior background activity was well-developed, in alpha range, reactive to eye opening and closure. There are frequent beta range activity  There was no evidence of epileptiform discharge.  Photic stimulation was performed, which induced a symmetric photic driving.  Hyperventilation was performed, there was no abnormality elicit.  No sleep was achieved.  CONCLUSION: This is a  normal awake EEG.  There is no electrodiagnostic evidence of epileptiform discharge.  Levert Feinstein, M.D. Ph.D.  [facility] [address] [place], [place] [address] Phone: [phone] Fax:      [phone]

## 2016-10-05 NOTE — Telephone Encounter (Signed)
-----   Message from Levert Feinstein, MD sent at 10/05/2016  9:06 AM EDT ----- Please call pt for normal eeg

## 2016-10-12 ENCOUNTER — Ambulatory Visit
Admission: RE | Admit: 2016-10-12 | Discharge: 2016-10-12 | Disposition: A | Discharge status: Home / Self Care | Payer: Medicare Other | Source: Ambulatory Visit | Attending: Neurology | Admitting: Neurology

## 2016-10-12 DIAGNOSIS — IMO0002 Reserved for concepts with insufficient information to code with codable children: Secondary | ICD-10-CM

## 2016-10-12 DIAGNOSIS — I639 Cerebral infarction, unspecified: Secondary | ICD-10-CM | POA: Diagnosis not present

## 2016-10-12 DIAGNOSIS — Z951 Presence of aortocoronary bypass graft: Secondary | ICD-10-CM

## 2016-10-12 DIAGNOSIS — R4020 Unspecified coma: Secondary | ICD-10-CM | POA: Diagnosis not present

## 2016-10-12 DIAGNOSIS — I6523 Occlusion and stenosis of bilateral carotid arteries: Secondary | ICD-10-CM | POA: Diagnosis not present

## 2016-10-12 DIAGNOSIS — R413 Other amnesia: Secondary | ICD-10-CM

## 2016-10-12 DIAGNOSIS — I6529 Occlusion and stenosis of unspecified carotid artery: Secondary | ICD-10-CM

## 2016-10-12 MED ORDER — GADOBENATE DIMEGLUMINE 529 MG/ML IV SOLN
11.0000 mL | Freq: Once | INTRAVENOUS | Status: AC | PRN
  Administered 2016-10-12: 11 mL via INTRAVENOUS

## 2016-10-15 ENCOUNTER — Telehealth: Payer: Self-pay | Admitting: Neurology

## 2016-10-15 NOTE — Telephone Encounter (Addendum)
Please call patient MRI of the brain showed extensive supratentorium small vessel disease evidence of previous stroke, generalized atrophy will review MRI of the brain at next follow-up visit, also check to see if she has recurrent spells of loss of consciousness after starting Keppra  MRA of neck showed left internal carotid artery 40% stenosis, MRAof the brain showed no significant abnormality   IMPRESSION:  This MRI of the brain without contrast shows the following: 1.    Chronic lacunar strokes involving the left basal ganglia and adjacent internal capsule and periventricular white matter. This was acute on the 10/31/2007 MRI. 2.     Extensive chronic microvascular ischemic changes involving the hemispheres, pons and thalamus, significantly progressed when compared to 2009 MRI. 3.     Moderate cortical atrophy, progressed when compared to 2009 MRI 4.     There is a single chronic microhemorrhage in the left frontal lobe of doubtful significance.

## 2016-10-15 NOTE — Telephone Encounter (Signed)
Spoke to patient and her husband - they are both aware of MRI and MRA results.  She has not had any further passing out events since starting Keppra.  She will continue the medication and keep her follow up for further discussion next month.

## 2016-11-09 DIAGNOSIS — Z23 Encounter for immunization: Secondary | ICD-10-CM | POA: Diagnosis not present

## 2016-11-15 DIAGNOSIS — K644 Residual hemorrhoidal skin tags: Secondary | ICD-10-CM | POA: Diagnosis not present

## 2016-11-15 DIAGNOSIS — K2270 Barrett's esophagus without dysplasia: Secondary | ICD-10-CM | POA: Diagnosis not present

## 2016-11-15 DIAGNOSIS — K219 Gastro-esophageal reflux disease without esophagitis: Secondary | ICD-10-CM | POA: Diagnosis not present

## 2016-11-28 ENCOUNTER — Ambulatory Visit (INDEPENDENT_AMBULATORY_CARE_PROVIDER_SITE_OTHER): Payer: Medicare Other | Admitting: Neurology

## 2016-11-28 ENCOUNTER — Encounter: Payer: Self-pay | Admitting: Neurology

## 2016-11-28 VITALS — BP 156/72 | HR 55 | Ht 63.0 in | Wt 123.0 lb

## 2016-11-28 DIAGNOSIS — R4020 Unspecified coma: Secondary | ICD-10-CM | POA: Diagnosis not present

## 2016-11-28 DIAGNOSIS — Z8673 Personal history of transient ischemic attack (TIA), and cerebral infarction without residual deficits: Secondary | ICD-10-CM | POA: Diagnosis not present

## 2016-11-28 DIAGNOSIS — I209 Angina pectoris, unspecified: Secondary | ICD-10-CM | POA: Diagnosis not present

## 2016-11-28 DIAGNOSIS — IMO0002 Reserved for concepts with insufficient information to code with codable children: Secondary | ICD-10-CM

## 2016-11-28 NOTE — Progress Notes (Signed)
PATIENT: Susan CalamityJudy [name] DOB: 02-25-1940  Chief Complaint  Patient presents with  . Passing out event/CVA    She is here with her husband, Donnie.  No further passing out events since starting Keppra.  They would like to review her MRI, MRA and EEG results.     HISTORICAL  Susan [name] 76 year old female, accompanied by her husband Donnie, seen in refer by Dr. Leonor LivHolt, Shea EvansLynley S, for evaluation of passing out episode,Initial evaluation was on September 20 2016  I have reviewed and summarized referring note, she reported a history of stroke, carotid artery occlusion, but could not elaborate on detail, she did reported a history of endarterectomy in the past, she also has history of hypertension, hyperlipidemia, coronary artery disease, most recent cardiac catheter in August 2018 showed multi vessels significant stenosis, saphenous graft was patent, ejection fraction was 55-65%, there was no wall region motion abnormality,  She is on aspirin 81 mg daily, she presented with 4 episode of passing out since June 2018,  First episode, she was sitting across the table with her husband, without warning signs, she slumped over, had transient loss of consciousness,  Another episode in July 2018 with her [name], sudden onset loss of consciousness, with loss of bowel and bladder control,  Most recent episode of passing out was on September 16 2016, she was sitting on the couch talking with her friend, she felt dizzy, had a few cough, then had sudden loss of consciousness, she was found by her husband stare into space, unresponsive for a few minutes, she denies heart palpitation before symptoms onset  I was able to review cardiac monitoring result from June 25 to July 09 2016 by Dr. Tomie Chinaevankar, minimum heart rate of 41, maximum was 158,  One run of ventricular tachycardia, with maximum heart rate of 130 bpm, episode of supraventricular tachycardia    UPDATE Nov 28 2016: She is now taking  Keppra 500 mg twice a day, there was no recurrent episode of passing out, she is to continue follow-up with her cardiologist, she did complains of intermittent skipped heartbeats,  I have personally reviewed MRI of brain, extensive micro-ischemic changes, chronic lacunar infarction at the left basal ganglion, adjacent internal capsule, periventricular white matter, moderate cortical atrophy, single chronic microhemorrhage in the left frontal lobe  MRA of the brain showed no large vessel disease.  MRA of the neck showed less than 40% stenosis of left internal carotid artery, no significant right internal carotid artery disease  She is on aspirin 81 mg daily  REVIEW OF SYSTEMS: Full 14 system review of systems performed and notable only for feeling cold, diarrhea  ALLERGIES: Allergies  Allergen Reactions  . Boniva [Ibandronic Acid] Shortness Of Breath  . Crestor [Rosuvastatin Calcium]     Weakness   . Fenofibrate     Muscle pain  . Vytorin [Ezetimibe-Simvastatin]     Weakness  . Welchol [Colesevelam Hcl]     Weakness  . Zetia [Ezetimibe]     Weakness     HOME MEDICATIONS: Current Outpatient Medications  Medication Sig Dispense Refill  . acetaminophen (TYLENOL) 500 MG tablet Take 1,000 mg by mouth every 6 (six) hours as needed (for pain.).    Marland Kitchen. aspirin 81 MG tablet Take 81 mg by mouth daily.     . calcium-vitamin D 250-100 MG-UNIT per tablet Take 1 tablet by mouth daily.     . carvedilol (COREG) 6.25 MG tablet Take 6.25 mg by mouth 2 (two) times daily  with a meal.    . cyanocobalamin 1000 MCG tablet Take 1,000 mcg by mouth daily.    Marland Kitchen. donepezil (ARICEPT) 10 MG tablet Take 10 mg by mouth at bedtime.    . isosorbide mononitrate (IMDUR) 30 MG 24 hr tablet Take 1 tablet (30 mg total) by mouth daily. 30 tablet 6  . levETIRAcetam (KEPPRA) 500 MG tablet Take 1 tablet (500 mg total) by mouth 2 (two) times daily. 60 tablet 11  . Omega-3-acid Ethyl Esters (LOVAZA PO) Take 1,000 mg by  mouth daily.    Marland Kitchen. omeprazole (PRILOSEC) 40 MG capsule Take 30 mg by mouth daily.  1  . pravastatin (PRAVACHOL) 40 MG tablet Take 40 mg by mouth daily.    . ramipril (ALTACE) 5 MG capsule Take 5 mg by mouth daily.      No current facility-administered medications for this visit.     PAST MEDICAL HISTORY: Past Medical History:  Diagnosis Date  . Arthritis   . Carotid artery occlusion   . GERD (gastroesophageal reflux disease)   . Hyperlipidemia   . Hypertension   . Stroke (HCC) 2005  . Syncope     PAST SURGICAL HISTORY: Past Surgical History:  Procedure Laterality Date  . ABDOMINAL HYSTERECTOMY    . CAROTID ENDARTERECTOMY    . CORONARY ARTERY BYPASS GRAFT    . LEFT HEART CATH AND CORS/GRAFTS ANGIOGRAPHY N/A 08/02/2016   Procedure: Left Heart Cath and Cors/Grafts Angiography;  Surgeon: Runell GessBerry, Jonathan J, MD;  [facility];  Service: Cardiovascular;  Laterality: N/A;    FAMILY HISTORY: Family History  Problem Relation Age of Onset  . Heart attack Mother   . Heart disease Father   . Heart attack Father     SOCIAL HISTORY:  Social History   Socioeconomic History  . Marital status: Married    Spouse name: Not on file  . Number of children: 1  . Years of education: 10th grade  . Highest education level: Not on file  Social Needs  . Financial resource strain: Not on file  . Food insecurity - worry: Not on file  . Food insecurity - inability: Not on file  . Transportation needs - medical: Not on file  . Transportation needs - non-medical: Not on file  Occupational History  . Occupation: Retired  Tobacco Use  . Smoking status: Former Smoker    Packs/day: 0.50    Years: 20.00    Pack years: 10.00    Types: Cigarettes    Last attempt to quit: 03/07/1981    Years since quitting: 35.7  . Smokeless tobacco: Never Used  Substance and Sexual Activity  . Alcohol use: No  . Drug use: No  . Sexual activity: Not on file  Other Topics Concern  . Not on file    Social History Narrative   Lives at home with her husband.   Right-handed.   1-2 cups caffeine per day.     PHYSICAL EXAM   Vitals:   11/28/16 1140  BP: (!) 156/72  Pulse: (!) 55  Weight: 123 lb (55.8 kg)  Height: 5\' 3"  (1.6 m)    Not recorded    Blood pressure lying down 161/63 heart rate of 57, sitting up 146/70 heart rate of 50, standing 160/74 heart rate of 54, standing for 3 minutes 146/69 heart rate of 54,  Body mass index is 21.79 kg/m.  PHYSICAL EXAMNIATION:  Gen: NAD, conversant, well nourised, obese, well groomed  Cardiovascular: Regular rate rhythm, no peripheral edema, warm, nontender. Eyes: Conjunctivae clear without exudates or hemorrhage Neck: Supple, no carotid bruits. Pulmonary: Clear to auscultation bilaterally   NEUROLOGICAL EXAM:  MENTAL STATUS: Speech:    Speech is normal; fluent and spontaneous with normal comprehension.  Cognition:     Orientation to time, place and person     Normal recent and remote memory     Normal Attention span and concentration     Normal Language, naming, repeating,spontaneous speech     Fund of knowledge   CRANIAL NERVES: CN II: Visual fields are full to confrontation. Fundoscopic exam is normal with sharp discs and no vascular changes. Pupils are round equal and briskly reactive to light. CN III, IV, VI: extraocular movement are normal. No ptosis. CN V: Facial sensation is intact to pinprick in all 3 divisions bilaterally. Corneal responses are intact.  CN VII: Face is symmetric with normal eye closure and smile. CN VIII: Hearing is normal to rubbing fingers CN IX, X: Palate elevates symmetrically. Phonation is normal. CN XI: Head turning and shoulder shrug are intact CN XII: Tongue is midline with normal movements and no atrophy.  MOTOR: There is no pronator drift of out-stretched arms. Muscle bulk and tone are normal. Muscle strength is normal.  REFLEXES: Reflexes are 2+ and symmetric at  the biceps, triceps, knees, and ankles. Plantar responses are flexor.  SENSORY: Intact to light touch, pinprick, positional sensation and vibratory sensation are intact in fingers and toes.  COORDINATION: Rapid alternating movements and fine finger movements are intact. There is no dysmetria on finger-to-nose and heel-knee-shin.    GAIT/STANCE: Posture is normal. Gait is steady with normal steps, base, arm swing, and turning. Heel and toe walking are normal. Tandem gait is normal.  Romberg is absent.   DIAGNOSTIC DATA (LABS, IMAGING, TESTING) - I reviewed patient records, labs, notes, testing and imaging myself where available.   ASSESSMENT AND PLAN  Susan [name] is a 76 y.o. female   Sudden onset loss of consciousness,  There was described staring spells, bowel bladder incontinence, differentiation diagnosis includes syncope, versus seizure,  Imperatively treated with Keppra 500 mg mg twice a day  MRI of the brain showed moderate generalized atrophy, extensive supratentorium small vessel disease,Lacunar infarction involving left basal ganglion, pons, thalamus,  EEG was normal in October 2018   Arrhythmia  Cardiac monitoring July 2018 showed underlying sinus rhythm, with intermittent episode of ventricular tachycardia, supraventricular tachycardia, some of the supraventricular tachycardia or possible atrial tachycardia with variable block, ventricular trigeminy was present  She is to continue follow-up with her cardiologist, cannot rule out the possibility of cardiogenic syncope with her current episode of passing out,  Levert Feinstein, M.D. Ph.D.  [facility] [address] [place], [place] [address] Ph: [phone] Fax: [phone]  CC: Marylen Ponto, MD

## 2016-12-14 ENCOUNTER — Ambulatory Visit (INDEPENDENT_AMBULATORY_CARE_PROVIDER_SITE_OTHER): Payer: Medicare Other | Admitting: Cardiology

## 2016-12-14 ENCOUNTER — Encounter: Payer: Self-pay | Admitting: Cardiology

## 2016-12-14 VITALS — BP 150/80 | HR 53 | Ht 63.0 in | Wt 123.0 lb

## 2016-12-14 DIAGNOSIS — I10 Essential (primary) hypertension: Secondary | ICD-10-CM

## 2016-12-14 DIAGNOSIS — I25119 Atherosclerotic heart disease of native coronary artery with unspecified angina pectoris: Secondary | ICD-10-CM

## 2016-12-14 DIAGNOSIS — I739 Peripheral vascular disease, unspecified: Secondary | ICD-10-CM

## 2016-12-14 DIAGNOSIS — Z8673 Personal history of transient ischemic attack (TIA), and cerebral infarction without residual deficits: Secondary | ICD-10-CM | POA: Diagnosis not present

## 2016-12-14 DIAGNOSIS — E782 Mixed hyperlipidemia: Secondary | ICD-10-CM

## 2016-12-14 DIAGNOSIS — Z951 Presence of aortocoronary bypass graft: Secondary | ICD-10-CM | POA: Diagnosis not present

## 2016-12-14 NOTE — Progress Notes (Signed)
Cardiology Office Note:    Date:  12/14/2016   ID:  Susan CalamityJudy [name], DOB 1940-04-19, MRN [id number]  PCP:  Susan [name], Susan S, MD  Cardiologist:  Susan Brothersajan R Kenzel Ruesch, MD   Referring MD: Susan [name], Susan S, MD    ASSESSMENT:    1. Coronary artery disease involving native coronary artery of native heart with angina pectoris (HCC)   2. Essential hypertension   3. H/O: CVA (cerebrovascular accident)   4. Hx of CABG   5. Mixed hyperlipidemia   6. Mixed dyslipidemia   7. Intermittent claudication (HCC)    PLAN:    In order of problems listed above:  1. Secondary prevention stressed with the patient.  Importance of compliance with diet and medications stressed and she vocalized understanding. 2. Her blood pressure is stable.  She has an element of whitecoat hypertension and she tells me that her blood pressures are better at home. 3. If she has a lipids followed by her primary care physician to be seen her doctor in the next few days for the same. 4. I told her that she could try coenzyme Q 10 for her cramps.  If that does not help I told her to be off statin medications for a week to see if it helps.  If it does not help then she will reinitiate the statin medications.  If going off those medications help her that she get in touch with us for further guidance before lipid-lowering therapy. 5. 3 months follow-up or earlier if she has any concerns.   Medication Adjustments/Labs and Tests Ordered: Current medicines are reviewed at length with the patient today.  Concerns regarding medicines are outlined above.  No orders of the defined types were placed in this encounter.  No orders of the defined types were placed in this encounter.    Chief Complaint  Patient presents with  . Follow-up     History of Present Illness:    Susan [name] is a 76 y.o. female.  The patient has known coronary artery disease post CABG surgery she has history of essential hypertension and dyslipidemia.  She denies  any problems at this time from a cardiovascular standpoint.  No chest pain orthopnea or PND.  She is active lately but does not exercise on a regular basis.  She mentions of some cramping pain in her calves at times.  At the time of my evaluation, the patient is alert awake oriented and in no distress.  Past Medical History:  Diagnosis Date  . Arthritis   . Carotid artery occlusion   . GERD (gastroesophageal reflux disease)   . Hyperlipidemia   . Hypertension   . Stroke (HCC) 2005  . Syncope     Past Surgical History:  Procedure Laterality Date  . ABDOMINAL HYSTERECTOMY    . CAROTID ENDARTERECTOMY    . CORONARY ARTERY BYPASS GRAFT    . LEFT HEART CATH AND CORS/GRAFTS ANGIOGRAPHY N/A 08/02/2016   Procedure: Left Heart Cath and Cors/Grafts Angiography;  Surgeon: Susan [name], Susan J, MD;  [facility];  Service: Cardiovascular;  Laterality: N/A;    Current Medications: Current Meds  Medication Sig  . acetaminophen (TYLENOL) 500 MG tablet Take 1,000 mg by mouth every 6 (six) hours as needed (for pain.).  Marland Kitchen. aspirin 81 MG tablet Take 81 mg by mouth daily.   . calcium-vitamin D 250-100 MG-UNIT per tablet Take 1 tablet by mouth daily.   . carvedilol (COREG) 6.25 MG tablet Take 6.25 mg by mouth  2 (two) times daily with a meal.  . cyanocobalamin 1000 MCG tablet Take 1,000 mcg by mouth daily.  Marland Kitchen. donepezil (ARICEPT) 10 MG tablet Take 10 mg by mouth at bedtime.  . isosorbide mononitrate (IMDUR) 30 MG 24 hr tablet Take 1 tablet (30 mg total) by mouth daily.  Marland Kitchen. levETIRAcetam (KEPPRA) 500 MG tablet Take 1 tablet (500 mg total) by mouth 2 (two) times daily.  . Omega-3-acid Ethyl Esters (LOVAZA PO) Take 1,000 mg by mouth daily.  Marland Kitchen. omeprazole (PRILOSEC) 40 MG capsule Take 30 mg by mouth daily.  . pravastatin (PRAVACHOL) 40 MG tablet Take 40 mg by mouth daily.  . ramipril (ALTACE) 5 MG capsule Take 5 mg by mouth daily.      Allergies:   Boniva [ibandronic acid]; Crestor [rosuvastatin  calcium]; Fenofibrate; Vytorin [ezetimibe-simvastatin]; Welchol [colesevelam hcl]; and Zetia [ezetimibe]   Social History   Socioeconomic History  . Marital status: Married    Spouse name: None  . Number of children: 1  . Years of education: 10th grade  . Highest education level: None  Social Needs  . Financial resource strain: None  . Food insecurity - worry: None  . Food insecurity - inability: None  . Transportation needs - medical: None  . Transportation needs - non-medical: None  Occupational History  . Occupation: Retired  Tobacco Use  . Smoking status: Former Smoker    Packs/day: 0.50    Years: 20.00    Pack years: 10.00    Types: Cigarettes    Last attempt to quit: 03/07/1981    Years since quitting: 35.7  . Smokeless tobacco: Never Used  Substance and Sexual Activity  . Alcohol use: No  . Drug use: No  . Sexual activity: None  Other Topics Concern  . None  Social History Narrative   Lives at home with her husband.   Right-handed.   1-2 cups caffeine per day.     Family History: The patient'[name] family history includes Heart attack in her father and mother; Heart disease in her father.  ROS:   Please see the history of present illness.    All other systems reviewed and are negative.  EKGs/Labs/Other Studies Reviewed:    The following studies were reviewed today: I reviewed her records and discussed my findings with the patient and husband in extensive length.  Questions were answered to their satisfaction.   Recent Labs: 07/30/2016: BUN 16; Creatinine, Ser 1.05; Hemoglobin 13.0; Platelets 290; Potassium 4.2; Sodium 145  Recent Lipid Panel No results found for: CHOL, TRIG, HDL, CHOLHDL, VLDL, LDLCALC, LDLDIRECT  Physical Exam:    VS:  BP (!) 150/80 (BP Location: Right Arm, Patient Position: Sitting, Cuff Size: Normal)   Pulse (!) 53   Ht 5\' 3"  (1.6 m)   Wt 123 lb (55.8 kg)   SpO2 98%   BMI 21.79 kg/m     Wt Readings from Last 3 Encounters:    12/14/16 123 lb (55.8 kg)  11/28/16 123 lb (55.8 kg)  09/20/16 119 lb 8 oz (54.2 kg)     GEN: Patient is in no acute distress HEENT: Normal NECK: No JVD; No carotid bruits LYMPHATICS: No lymphadenopathy CARDIAC: Hear sounds regular, 2/6 systolic murmur at the apex. RESPIRATORY:  Clear to auscultation without rales, wheezing or rhonchi  ABDOMEN: Soft, non-tender, non-distended MUSCULOSKELETAL:  No edema; No deformity  SKIN: Warm and dry NEUROLOGIC:  Alert and oriented x 3 PSYCHIATRIC:  Normal affect   Signed, Susan Brothersajan R Aden Youngman, MD  12/14/2016 11:41 AM    [facility]

## 2016-12-14 NOTE — Patient Instructions (Signed)
Medication Instructions:  Your physician recommends that you continue on your current medications as directed. Please refer to the Current Medication list given to you today.  Labwork: None  Testing/Procedures: None  Follow-Up: Your physician recommends that you schedule a follow-up appointment in: 3 months  Any Other Special Instructions Will Be Listed Below (If Applicable).     If you need a refill on your cardiac medications before your next appointment, please call your pharmacy.   [facility]  Maalik Pinn A, RN, BSN  

## 2017-02-06 DIAGNOSIS — I10 Essential (primary) hypertension: Secondary | ICD-10-CM | POA: Diagnosis not present

## 2017-02-06 DIAGNOSIS — E785 Hyperlipidemia, unspecified: Secondary | ICD-10-CM | POA: Diagnosis not present

## 2017-02-06 DIAGNOSIS — Z79899 Other long term (current) drug therapy: Secondary | ICD-10-CM | POA: Diagnosis not present

## 2017-02-06 DIAGNOSIS — G2581 Restless legs syndrome: Secondary | ICD-10-CM | POA: Diagnosis not present

## 2017-02-06 DIAGNOSIS — Z9181 History of falling: Secondary | ICD-10-CM | POA: Diagnosis not present

## 2017-03-14 ENCOUNTER — Ambulatory Visit: Payer: Medicare Other | Admitting: Cardiology

## 2017-03-18 ENCOUNTER — Emergency Department (HOSPITAL_BASED_OUTPATIENT_CLINIC_OR_DEPARTMENT_OTHER): Payer: Medicare Other

## 2017-03-18 ENCOUNTER — Other Ambulatory Visit: Payer: Self-pay

## 2017-03-18 ENCOUNTER — Encounter (HOSPITAL_BASED_OUTPATIENT_CLINIC_OR_DEPARTMENT_OTHER): Payer: Self-pay | Admitting: Emergency Medicine

## 2017-03-18 ENCOUNTER — Encounter: Payer: Self-pay | Admitting: Cardiology

## 2017-03-18 ENCOUNTER — Emergency Department (HOSPITAL_BASED_OUTPATIENT_CLINIC_OR_DEPARTMENT_OTHER)
Admission: EM | Admit: 2017-03-18 | Discharge: 2017-03-18 | Disposition: A | Discharge status: Home / Self Care | Payer: Medicare Other | Attending: Emergency Medicine | Admitting: Emergency Medicine

## 2017-03-18 ENCOUNTER — Ambulatory Visit (INDEPENDENT_AMBULATORY_CARE_PROVIDER_SITE_OTHER): Payer: Medicare Other | Admitting: Cardiology

## 2017-03-18 VITALS — BP 120/80 | HR 62 | Ht 63.0 in | Wt 123.0 lb

## 2017-03-18 DIAGNOSIS — Z79899 Other long term (current) drug therapy: Secondary | ICD-10-CM | POA: Diagnosis not present

## 2017-03-18 DIAGNOSIS — R61 Generalized hyperhidrosis: Secondary | ICD-10-CM | POA: Diagnosis not present

## 2017-03-18 DIAGNOSIS — R4020 Unspecified coma: Secondary | ICD-10-CM

## 2017-03-18 DIAGNOSIS — I25119 Atherosclerotic heart disease of native coronary artery with unspecified angina pectoris: Secondary | ICD-10-CM | POA: Diagnosis not present

## 2017-03-18 DIAGNOSIS — Z7982 Long term (current) use of aspirin: Secondary | ICD-10-CM | POA: Insufficient documentation

## 2017-03-18 DIAGNOSIS — I10 Essential (primary) hypertension: Secondary | ICD-10-CM

## 2017-03-18 DIAGNOSIS — R55 Syncope and collapse: Secondary | ICD-10-CM | POA: Diagnosis not present

## 2017-03-18 DIAGNOSIS — Z8673 Personal history of transient ischemic attack (TIA), and cerebral infarction without residual deficits: Secondary | ICD-10-CM | POA: Insufficient documentation

## 2017-03-18 DIAGNOSIS — G40909 Epilepsy, unspecified, not intractable, without status epilepticus: Secondary | ICD-10-CM | POA: Diagnosis not present

## 2017-03-18 DIAGNOSIS — Z87891 Personal history of nicotine dependence: Secondary | ICD-10-CM | POA: Diagnosis not present

## 2017-03-18 DIAGNOSIS — I739 Peripheral vascular disease, unspecified: Secondary | ICD-10-CM

## 2017-03-18 DIAGNOSIS — E782 Mixed hyperlipidemia: Secondary | ICD-10-CM

## 2017-03-18 DIAGNOSIS — I2510 Atherosclerotic heart disease of native coronary artery without angina pectoris: Secondary | ICD-10-CM | POA: Insufficient documentation

## 2017-03-18 DIAGNOSIS — Z951 Presence of aortocoronary bypass graft: Secondary | ICD-10-CM | POA: Diagnosis not present

## 2017-03-18 DIAGNOSIS — R569 Unspecified convulsions: Secondary | ICD-10-CM | POA: Diagnosis not present

## 2017-03-18 DIAGNOSIS — IMO0002 Reserved for concepts with insufficient information to code with codable children: Secondary | ICD-10-CM

## 2017-03-18 LAB — CBC WITH DIFFERENTIAL/PLATELET
BASOS ABS: 0.1 10*3/uL (ref 0.0–0.1)
BASOS PCT: 1 %
Eosinophils Absolute: 0.4 10*3/uL (ref 0.0–0.7)
Eosinophils Relative: 4 %
HEMATOCRIT: 37.6 % (ref 36.0–46.0)
Hemoglobin: 12.4 g/dL (ref 12.0–15.0)
Lymphocytes Relative: 33 %
Lymphs Abs: 3.1 10*3/uL (ref 0.7–4.0)
MCH: 30.5 pg (ref 26.0–34.0)
MCHC: 33 g/dL (ref 30.0–36.0)
MCV: 92.4 fL (ref 78.0–100.0)
Monocytes Absolute: 1.2 10*3/uL (ref 0.1–1.0)
Monocytes Relative: 13 %
NEUTROS ABS: 4.6 10*3/uL (ref 1.7–7.7)
NEUTROS PCT: 49 %
Platelets: 260 10*3/uL (ref 150–400)
RBC: 4.07 MIL/uL (ref 3.87–5.11)
RDW: 13 % (ref 11.5–15.5)
WBC: 9.3 10*3/uL (ref 4.0–10.5)

## 2017-03-18 LAB — BASIC METABOLIC PANEL
ANION GAP: 7 (ref 5–15)
BUN: 12 mg/dL (ref 6–20)
CALCIUM: 8.8 mg/dL — AB (ref 8.9–10.3)
CO2: 27 mmol/L (ref 22–32)
Chloride: 104 mmol/L (ref 101–111)
Creatinine, Ser: 1.18 mg/dL — ABNORMAL HIGH (ref 0.44–1.00)
GFR calc non Af Amer: 43 mL/min — ABNORMAL LOW (ref >60)
GFR, EST AFRICAN AMERICAN: 50 mL/min — AB (ref >60)
GLUCOSE: 94 mg/dL (ref 65–99)
POTASSIUM: 3.9 mmol/L (ref 3.5–5.1)
Sodium: 138 mmol/L (ref 135–145)

## 2017-03-18 LAB — TROPONIN I: Troponin I: <0.03 ng/mL (ref <0.03)

## 2017-03-18 MED ORDER — ACETAMINOPHEN 325 MG PO TABS
650.0000 mg | ORAL_TABLET | Freq: Once | ORAL | Status: AC
  Administered 2017-03-18: 650 mg via ORAL
  Filled 2017-03-18: qty 2

## 2017-03-18 MED ORDER — SODIUM CHLORIDE 0.9 % IV BOLUS (SEPSIS)
500.0000 mL | Freq: Once | INTRAVENOUS | Status: AC
Start: 2017-03-18 — End: 2017-03-18
  Administered 2017-03-18: 500 mL via INTRAVENOUS

## 2017-03-18 NOTE — ED Notes (Signed)
Pt was changed out of soiled (urine) clothing and placed in a gown

## 2017-03-18 NOTE — ED Notes (Signed)
NAD at this time. Pt is stable and going home.  

## 2017-03-18 NOTE — Progress Notes (Signed)
Cardiology Office Note:    Date:  03/18/2017   ID:  Susan [name], DOB 07/23/1940, MRN [id number]  PCP:  Marylen Ponto, MD  Cardiologist:  Garwin Brothers, MD   Referring MD: Marylen Ponto, MD    ASSESSMENT:    1. Seizure (HCC)   2. Coronary artery disease involving native coronary artery of native heart with angina pectoris (HCC)   3. Essential hypertension   4. H/O: CVA (cerebrovascular accident)   5. Hx of CABG   6. Intermittent claudication (HCC)   7. Mixed dyslipidemia   8. Passed out [facility])    PLAN:    In order of problems listed above:  1. In my opinion the patient had a seizure-like activity as discussed below.  I discussed this with her and the fact that we will have presented to the emergency room and she and her husband are agreeable. 2. I discussed this with the nurse team who came to pick her up.  In view of the above I did not do any specific evaluation from a coronary artery standpoint.  We will see her in evaluation when once her current issues are resolved. 3. Patient will be seen in follow-up appointment in 3 months or earlier if the patient has any concerns    Medication Adjustments/Labs and Tests Ordered: Current medicines are reviewed at length with the patient today.  Concerns regarding medicines are outlined above.  No orders of the defined types were placed in this encounter.  No orders of the defined types were placed in this encounter.    Chief Complaint  Patient presents with  . Follow-up  . Coronary Artery Disease     History of Present Illness:    Susan [name] is a 77 y.o. female.  Patient has known coronary artery disease and was here for routine follow-up.  As soon as my CNA with the preliminary and came out of the room I went in to see her.  There was hardly any time.  My CNA mentions to me that she was fine when he evaluated her.  As I walked into the room it appeared as if she was sleeping.  It appears as if she was dozing off.  I  tried to wake her up but she did not respond.  At that time she had a seizure-like activity of the upper extremities.  She was not responsive for a total period of about 2 minutes or so.  We put her down.  She may have coughed up some saliva.  We put her on her left side to prevent aspiration.  She also urinated.  This became better and she recovered.  During that time her pulse was very thready for about a minute and then came back completely normal and she was alert awake and oriented.  We called the emergency room for her to be transported downstairs.  And at the time of the transport she was back to her normal self.  Past Medical History:  Diagnosis Date  . Arthritis   . Carotid artery occlusion   . GERD (gastroesophageal reflux disease)   . Hyperlipidemia   . Hypertension   . Stroke (HCC) 2005  . Syncope     Past Surgical History:  Procedure Laterality Date  . ABDOMINAL HYSTERECTOMY    . CAROTID ENDARTERECTOMY    . CORONARY ARTERY BYPASS GRAFT    . LEFT HEART CATH AND CORS/GRAFTS ANGIOGRAPHY N/A 08/02/2016   Procedure: Left Heart Cath and Cors/Grafts Angiography;  Surgeon: Runell Gess, MD;  Location: [facility] INVASIVE CV LAB;  Service: Cardiovascular;  Laterality: N/A;    Current Medications: Current Meds  Medication Sig  . acetaminophen (TYLENOL) 500 MG tablet Take 1,000 mg by mouth every 6 (six) hours as needed (for pain.).  Marland Kitchen aspirin 81 MG tablet Take 81 mg by mouth daily.   . carvedilol (COREG) 6.25 MG tablet Take 6.25 mg by mouth 2 (two) times daily with a meal.  . cyanocobalamin 1000 MCG tablet Take 1,000 mcg by mouth daily.  Marland Kitchen donepezil (ARICEPT) 10 MG tablet Take 10 mg by mouth at bedtime.  . isosorbide mononitrate (IMDUR) 30 MG 24 hr tablet Take 1 tablet (30 mg total) by mouth daily.  Marland Kitchen levETIRAcetam (KEPPRA) 500 MG tablet Take 1 tablet (500 mg total) by mouth 2 (two) times daily.  . Omega-3-acid Ethyl Esters (LOVAZA PO) Take 1,000 mg by mouth daily.  Marland Kitchen omeprazole  (PRILOSEC) 40 MG capsule Take 30 mg by mouth daily.  . pravastatin (PRAVACHOL) 40 MG tablet Take 40 mg by mouth daily.  . ramipril (ALTACE) 5 MG capsule Take 5 mg by mouth daily.   . [DISCONTINUED] calcium-vitamin D 250-100 MG-UNIT per tablet Take 1 tablet by mouth daily.      Allergies:   Boniva [ibandronic acid]; Crestor [rosuvastatin calcium]; Fenofibrate; Vytorin [ezetimibe-simvastatin]; Welchol [colesevelam hcl]; and Zetia [ezetimibe]   Social History   Socioeconomic History  . Marital status: Married    Spouse name: None  . Number of children: 1  . Years of education: 10th grade  . Highest education level: None  Social Needs  . Financial resource strain: None  . Food insecurity - worry: None  . Food insecurity - inability: None  . Transportation needs - medical: None  . Transportation needs - non-medical: None  Occupational History  . Occupation: Retired  Tobacco Use  . Smoking status: Former Smoker    Packs/day: 0.50    Years: 20.00    Pack years: 10.00    Types: Cigarettes    Last attempt to quit: 03/07/1981    Years since quitting: 36.0  . Smokeless tobacco: Never Used  Substance and Sexual Activity  . Alcohol use: No  . Drug use: No  . Sexual activity: None  Other Topics Concern  . None  Social History Narrative   Lives at home with her husband.   Right-handed.   1-2 cups caffeine per day.     Family History: The patient's family history includes Heart attack in her father and mother; Heart disease in her father.  ROS:   Please see the history of present illness.    All other systems reviewed and are negative.  EKGs/Labs/Other Studies Reviewed:    The following studies were reviewed today: I discussed the findings of the previous test with the patient at length.  The husband mentions to me that he feels that overall she has been asymptomatic in general.   Recent Labs: 07/30/2016: BUN 16; Creatinine, Ser 1.05; Hemoglobin 13.0; Platelets 290; Potassium  4.2; Sodium 145  Recent Lipid Panel No results found for: CHOL, TRIG, HDL, CHOLHDL, VLDL, LDLCALC, LDLDIRECT  Physical Exam:    VS:  BP 120/80 (BP Location: Right Arm, Patient Position: Sitting, Cuff Size: Normal)   Pulse 62   Ht 5\' 3"  (1.6 m)   Wt 123 lb (55.8 kg)   SpO2 96%   BMI 21.79 kg/m     Wt Readings from Last 3 Encounters:  03/18/17 123 lb (55.8  kg)  12/14/16 123 lb (55.8 kg)  11/28/16 123 lb (55.8 kg)     GEN: Patient is in no acute distress HEENT: Normal NECK: No JVD; No carotid bruits LYMPHATICS: No lymphadenopathy CARDIAC: Hear sounds regular, 2/6 systolic murmur at the apex. RESPIRATORY:  Clear to auscultation without rales, wheezing or rhonchi  ABDOMEN: Soft, non-tender, non-distended MUSCULOSKELETAL:  No edema; No deformity  SKIN: Warm and dry NEUROLOGIC:  Alert and oriented x 3 PSYCHIATRIC:  Normal affect   Signed, Garwin Brothersajan R Buffie Herne, MD  03/18/2017 10:27 AM    [facility]

## 2017-03-18 NOTE — ED Provider Notes (Signed)
[facility] EMERGENCY DEPARTMENT Provider Note   CSN: [id number] Arrival date & time: 03/18/17  1028     History   Chief Complaint Chief Complaint  Patient presents with  . Seizures    HPI Susan [name] is a 77 y.o. female.  Patient is a 77 year old female with a history of hypertension, seizure, stroke, syncope, hyperlipidemia, coronary artery disease status post CABG who is coming from the cardiology office upstairs today after having an episode of loss of consciousness with concern for seizure.  States that she has had a cold for the last 10 days and has not felt great.  She has had cough, congestion and decreased oral intake.  However she has been taking all of her medications as prescribed.  He did eat breakfast this morning but states when she got to the office she was not feeling well but went to her appointment anyway.  She was finished with her appointment and the doctor came in and she was thought to be sleeping.  However when they attempted to wake her up she became nonresponsive, diaphoretic, had some mild vomiting and her physician describes some shaking of her bilateral upper extremities for a brief moment as well as urinary incontinence.  Patient did not have a significantly prolonged postictal period and came around fairly rapidly.  She does not recall what happened and denied any palpitations, chest pain or shortness of breath prior to or after the episode.  She currently has no complaints.  She denies fever, urinary issues or diarrhea.  Husband states this is the fourth time this is happened since they have been married over the last 8 years.  During these episodes she usually loses control of her bladder but has also at times defecated on herself.  She usually comes around and is lucid quickly after the event.  She did see Dr. Terrace Arabia with neurology and they told her that everything looked okay.  She was started on Keppra which she has been taking regularly.   The  history is provided by the patient and the spouse.    Past Medical History:  Diagnosis Date  . Arthritis   . Carotid artery occlusion   . GERD (gastroesophageal reflux disease)   . Hyperlipidemia   . Hypertension   . Seizures (HCC)   . Stroke (HCC) 2005  . Syncope     Patient Active Problem List   Diagnosis Date Noted  . Seizure (HCC) 03/18/2017  . H/O: CVA (cerebrovascular accident) 11/28/2016  . Passed out [facility]) 09/20/2016  . Occlusion of carotid artery 09/20/2016  . Coronary artery disease involving native coronary artery of native heart with angina pectoris (HCC) 07/24/2016  . Hx of CABG 07/24/2016  . Essential hypertension 07/24/2016  . Mixed dyslipidemia 07/24/2016  . Intermittent claudication (HCC) 07/24/2016  . Aftercare following surgery of the circulatory system, NEC 04/29/2012  . Occlusion and stenosis of carotid artery without mention of cerebral infarction 03/08/2011    Past Surgical History:  Procedure Laterality Date  . ABDOMINAL HYSTERECTOMY    . CAROTID ENDARTERECTOMY    . CORONARY ARTERY BYPASS GRAFT    . LEFT HEART CATH AND CORS/GRAFTS ANGIOGRAPHY N/A 08/02/2016   Procedure: Left Heart Cath and Cors/Grafts Angiography;  Surgeon: Runell Gess, MD;  Location: [facility] INVASIVE CV LAB;  Service: Cardiovascular;  Laterality: N/A;    OB History    No data available       Home Medications    Prior to Admission medications  Medication Sig Start Date End Date Taking? Authorizing Provider  acetaminophen (TYLENOL) 500 MG tablet Take 1,000 mg by mouth every 6 (six) hours as needed (for pain.).    [provider]  aspirin 81 MG tablet Take 81 mg by mouth daily.     [provider]  carvedilol (COREG) 6.25 MG tablet Take 6.25 mg by mouth 2 (two) times daily with a meal.    [provider]  cyanocobalamin 1000 MCG tablet Take 1,000 mcg by mouth daily.    [provider]  donepezil (ARICEPT) 10 MG tablet Take 10 mg by mouth at  bedtime.    [provider]  isosorbide mononitrate (IMDUR) 30 MG 24 hr tablet Take 1 tablet (30 mg total) by mouth daily. 08/14/16 08/14/17  Revankar, Aundra Dubinajan R, MD  levETIRAcetam (KEPPRA) 500 MG tablet Take 1 tablet (500 mg total) by mouth 2 (two) times daily. 09/20/16   Levert FeinsteinYan, Yijun, MD  Omega-3-acid Ethyl Esters (LOVAZA PO) Take 1,000 mg by mouth daily.    [provider]  omeprazole (PRILOSEC) 40 MG capsule Take 30 mg by mouth daily. 08/03/16   [provider]  pravastatin (PRAVACHOL) 40 MG tablet Take 40 mg by mouth daily.    [provider]  ramipril (ALTACE) 5 MG capsule Take 5 mg by mouth daily.     [provider]    Family History Family History  Problem Relation Age of Onset  . Heart attack Mother   . Heart disease Father   . Heart attack Father     Social History Social History   Tobacco Use  . Smoking status: Former Smoker    Packs/day: 0.50    Years: 20.00    Pack years: 10.00    Types: Cigarettes    Last attempt to quit: 03/07/1981    Years since quitting: 36.0  . Smokeless tobacco: Never Used  Substance Use Topics  . Alcohol use: No  . Drug use: No     Allergies   Boniva [ibandronic acid]; Crestor [rosuvastatin calcium]; Fenofibrate; Vytorin [ezetimibe-simvastatin]; Welchol [colesevelam hcl]; and Zetia [ezetimibe]   Review of Systems Review of Systems  All other systems reviewed and are negative.    Physical Exam Updated Vital Signs BP (!) 141/60   Pulse (!) 52   Temp 98.1 F (36.7 C) (Oral)   Resp 13   Ht 5\' 3"  (1.6 m)   Wt 55.8 kg (123 lb)   SpO2 95%   BMI 21.79 kg/m   Physical Exam  Constitutional: She is oriented to person, place, and time. She appears well-developed and well-nourished. No distress.  HENT:  Head: Normocephalic and atraumatic.  Mouth/Throat: Oropharynx is clear and moist. Mucous membranes are dry.  Eyes: Conjunctivae and EOM are normal. Pupils are equal, round, and reactive to light.    Neck: Normal range of motion. Neck supple.  Cardiovascular: Normal rate, regular rhythm and intact distal pulses.  Murmur heard. Pulmonary/Chest: Effort normal and breath sounds normal. No respiratory distress. She has no wheezes. She has no rales.  Abdominal: Soft. She exhibits no distension. There is no tenderness. There is no rebound and no guarding.  Musculoskeletal: Normal range of motion. She exhibits no edema or tenderness.  Neurological: She is alert and oriented to person, place, and time. She has normal strength. No cranial nerve deficit or sensory deficit.  Skin: Skin is warm and dry. No rash noted. No erythema.  Psychiatric: She has a normal mood and affect. Her behavior is  normal.  Nursing note and vitals reviewed.    ED Treatments / Results  Labs (all labs ordered are listed, but only abnormal results are displayed) Labs Reviewed  BASIC METABOLIC PANEL - Abnormal; Notable for the following components:      Result Value   Creatinine, Ser 1.18 (*)    Calcium 8.8 (*)    GFR calc non Af Amer 43 (*)    GFR calc Af Amer 50 (*)    All other components within normal limits  CBC WITH DIFFERENTIAL/PLATELET  TROPONIN I    EKG  EKG Interpretation  Date/Time:  Monday March 18 2017 10:38:29 EDT Ventricular Rate:  51 PR Interval:    QRS Duration: 96 QT Interval:  506 QTC Calculation: 467 R Axis:   74 Text Interpretation:  Sinus rhythm Normal ECG Confirmed by Gwyneth Sprout ([id number]) on 03/18/2017 11:12:33 AM       Radiology Dg Chest 2 View  Result Date: 03/18/2017 CLINICAL DATA:  Seizure today. EXAM: CHEST - 2 VIEW COMPARISON:  01/04/2006 FINDINGS: Heart size and pulmonary vascularity are normal. CABG. Aortic atherosclerosis. No infiltrates or effusions. Chronic or recurrent peribronchial thickening. No acute bone abnormality. IMPRESSION: Peribronchial thickening which is either chronic or recurrent, unchanged since the prior study. Aortic Atherosclerosis (ICD10-I70.0).  Electronically Signed   By: Francene Boyers M.D.   On: 03/18/2017 11:22    Procedures Procedures (including critical care time)  Medications Ordered in ED Medications  sodium chloride 0.9 % bolus 500 mL (not administered)     Initial Impression / Assessment and Plan / ED Course  I have reviewed the triage vital signs and the nursing notes.  Pertinent labs & imaging results that were available during my care of the patient were reviewed by me and considered in my medical decision making (see chart for details).    Patient presenting today from upstairs with concern for possible seizure today versus syncopal event.  Patient has had multiple episodes of this per her husband she has been worked up by neurology without any acute findings per report but has been placed on Keppra.  She has been sick recently with URI symptoms which would lower her threshold for seizure as well as concern for dehydration because she is not been eating and drinking well.  Also this could typically lead to a syncopal event as well.  Patient did not have any prolonged postictal event after this episode.  It per cardiology her pulse was thready at best and she quickly came back around.  Patient has no complaints currently and vital signs are reassuring. CBC, BMP, troponin and x-ray pending.  Patient's EKG is reassuring.  Patient will be given IV fluids.  1:03 PM Patient feeling better.  Still complaining of a mild right-sided headache but otherwise able to stand and ambulate without difficulty.  She denies any chest pain or shortness of breath with walking.  She is able to speak in complete sentences with walking.  She is requesting to go home.  Recommended close follow-up with PCP and return to the hospital for admission if she has any recurrent events.  Final Clinical Impressions(s) / ED Diagnoses   Final diagnoses:  Loss of consciousness (HCC)  Seizures [facility])    ED Discharge Orders    None       Gwyneth Sprout, MD 03/18/17 1305

## 2017-03-18 NOTE — ED Triage Notes (Signed)
Pt had witnessed seizure in cardiology office.  No seizure activity noted presently.  Pt is sleepy but arouses to voice.

## 2017-03-25 DIAGNOSIS — Z6821 Body mass index (BMI) 21.0-21.9, adult: Secondary | ICD-10-CM | POA: Diagnosis not present

## 2017-03-25 DIAGNOSIS — R569 Unspecified convulsions: Secondary | ICD-10-CM | POA: Diagnosis not present

## 2017-03-26 ENCOUNTER — Encounter: Payer: Self-pay | Admitting: Neurology

## 2017-03-26 ENCOUNTER — Ambulatory Visit (INDEPENDENT_AMBULATORY_CARE_PROVIDER_SITE_OTHER): Payer: Medicare Other | Admitting: Neurology

## 2017-03-26 VITALS — BP 110/61 | HR 57 | Ht 63.0 in | Wt 123.0 lb

## 2017-03-26 DIAGNOSIS — R569 Unspecified convulsions: Secondary | ICD-10-CM

## 2017-03-26 DIAGNOSIS — I6522 Occlusion and stenosis of left carotid artery: Secondary | ICD-10-CM | POA: Diagnosis not present

## 2017-03-26 MED ORDER — LEVETIRACETAM 1000 MG PO TABS: 1000.0000 mg | ORAL_TABLET | Freq: Two times a day (BID) | ORAL | 11 refills | Status: DC

## 2017-03-26 MED ORDER — LAMOTRIGINE 150 MG PO TABS: 150.0000 mg | ORAL_TABLET | Freq: Two times a day (BID) | ORAL | 11 refills | Status: DC

## 2017-03-26 MED ORDER — LAMOTRIGINE 25 MG PO TABS: 25.0000 mg | ORAL_TABLET | Freq: Every day | ORAL | 0 refills | Status: DC

## 2017-03-26 NOTE — Progress Notes (Addendum)
PATIENT: Susan [name] DOB: 1940/07/30  Chief Complaint  Patient presents with  . Follow-up    Hospital follow up for possible seizure. PCP: Dr. Juleen China     HISTORICAL  Susan [name] 77 year old female, accompanied by her husband Susan [name], seen in refer by Dr. Leonor Liv, Shea Evans, for evaluation of passing out episode,Initial evaluation was on September 20 2016  I have reviewed and summarized referring note, she reported a history of stroke, carotid artery occlusion, but could not elaborate on detail, she did reported a history of endarterectomy in the past, she also has history of hypertension, hyperlipidemia, coronary artery disease, most recent cardiac catheter in August 2018 showed multi vessels significant stenosis, saphenous graft was patent, ejection fraction was 55-65%, there was no wall region motion abnormality,  She is on aspirin 81 mg daily, she presented with 4 episode of passing out since June 2018,  First episode, she was sitting across the table with her husband, without warning signs, she slumped over, had transient loss of consciousness,  Another episode in July 2018 with her granddaughter Susan [name], sudden onset loss of consciousness, with loss of bowel and bladder control,  Most recent episode of passing out was on September 16 2016, she was sitting on the couch talking with her friend, she felt dizzy, had a few cough, then had sudden loss of consciousness, she was found by her husband days into space, unresponsive for a few minutes, she denies heart palpitation before symptoms onset  I was able to review cardiac monitoring result from June 25 to July 09 2016 by Dr. Tomie China, minimum heart rate of 41, maximum was 158,  One run of ventricular tachycardia, with maximum heart rate of 130 bpm, episode of supraventricular tachycardia    UPDATE March 26 2017: She is accompanied by her husband at today's clinical visit, she was admitted to the hospital on March 18, 2017, she  was with her husband at her cardiologist's office, had a witnessed sudden onset loss of consciousness, seizure-like activity, was sent to the emergency room, patient reported in the morning of the office visit, she did not feel well, just recovering from her cold, which has lingered around for 10 days, she was found diaphoretic, not responsive, there was urinary incontinence, body shaking episode, she has mild post event confusion, woke up in the elevator confused, has no recollection of the event,  Husband reported that this is the fourth similar episode over the past 8 years, this episode in March 2019 happened while she was taking Keppra 250 mg twice a day, the dose was increased to 1000 mg twice a day, initially she complains of lightheadedness and dizziness, now she tolerated well, but today she complains of feeling anxious, afraid to leave home, feeling embarrassed worried about incontinence sitting at church,  MRA of neck showed left internal carotid artery 40% stenosis, MRAof the brain showed no significant abnormality   MRI of the brain without contrast shows the following: Intensive small vessel disease involving bilateral hemispheres, pons segments, significantly progressed compared to 2009, moderate cortical atrophy, also progress, chronic microhemorrhage in the left frontal lobe, chronic lacunar infarction involving left basal ganglion, adjacent internal capsule and periventricular white matter.  She was noted to have another minor spell in March 24, sitting there, suddenly broke out in sweat, no loss of consciousness,   EEG was normal in October 2018   REVIEW OF SYSTEMS: Full 14 system review of systems performed and notable only for feeling cold, diarrhea  ALLERGIES: Allergies  Allergen Reactions  . Boniva [Ibandronic Acid] Shortness Of Breath  . Crestor [Rosuvastatin Calcium]     Weakness   . Fenofibrate     Muscle pain  . Vytorin [Ezetimibe-Simvastatin]     Weakness  .  Welchol [Colesevelam Hcl]     Weakness  . Zetia [Ezetimibe]     Weakness     HOME MEDICATIONS: Current Outpatient Medications  Medication Sig Dispense Refill  . acetaminophen (TYLENOL) 500 MG tablet Take 1,000 mg by mouth every 6 (six) hours as needed (for pain.).    Marland Kitchen aspirin 81 MG tablet Take 81 mg by mouth daily.     . carvedilol (COREG) 6.25 MG tablet Take 6.25 mg by mouth 2 (two) times daily with a meal.    . cyanocobalamin 1000 MCG tablet Take 1,000 mcg by mouth daily.    Marland Kitchen donepezil (ARICEPT) 10 MG tablet Take 10 mg by mouth at bedtime.    . isosorbide mononitrate (IMDUR) 30 MG 24 hr tablet Take 1 tablet (30 mg total) by mouth daily. 30 tablet 6  . levETIRAcetam (KEPPRA) 500 MG tablet Take 1 tablet (500 mg total) by mouth 2 (two) times daily. 60 tablet 11  . Omega-3-acid Ethyl Esters (LOVAZA PO) Take 1,000 mg by mouth daily.    Marland Kitchen omeprazole (PRILOSEC) 40 MG capsule Take 30 mg by mouth daily.  1  . pravastatin (PRAVACHOL) 40 MG tablet Take 40 mg by mouth daily.    . ramipril (ALTACE) 5 MG capsule Take 5 mg by mouth daily.      No current facility-administered medications for this visit.     PAST MEDICAL HISTORY: Past Medical History:  Diagnosis Date  . Arthritis   . Carotid artery occlusion   . GERD (gastroesophageal reflux disease)   . Hyperlipidemia   . Hypertension   . Seizures (HCC)   . Stroke (HCC) 2005  . Syncope     PAST SURGICAL HISTORY: Past Surgical History:  Procedure Laterality Date  . ABDOMINAL HYSTERECTOMY    . CAROTID ENDARTERECTOMY    . CORONARY ARTERY BYPASS GRAFT    . LEFT HEART CATH AND CORS/GRAFTS ANGIOGRAPHY N/A 08/02/2016   Procedure: Left Heart Cath and Cors/Grafts Angiography;  Surgeon: Runell Gess, MD;  Location: [facility] INVASIVE CV LAB;  Service: Cardiovascular;  Laterality: N/A;    FAMILY HISTORY: Family History  Problem Relation Age of Onset  . Heart attack Mother   . Heart disease Father   . Heart attack Father     SOCIAL  HISTORY:  Social History   Socioeconomic History  . Marital status: Married    Spouse name: Not on file  . Number of children: 1  . Years of education: 10th grade  . Highest education level: Not on file  Occupational History  . Occupation: Retired  Engineer, production  . Financial resource strain: Not on file  . Food insecurity:    Worry: Not on file    Inability: Not on file  . Transportation needs:    Medical: Not on file    Non-medical: Not on file  Tobacco Use  . Smoking status: Former Smoker    Packs/day: 0.50    Years: 20.00    Pack years: 10.00    Types: Cigarettes    Last attempt to quit: 03/07/1981    Years since quitting: 36.0  . Smokeless tobacco: Never Used  Substance and Sexual Activity  . Alcohol use: No  . Drug use: No  .  Sexual activity: Not on file  Lifestyle  . Physical activity:    Days per week: Not on file    Minutes per session: Not on file  . Stress: Not on file  Relationships  . Social connections:    Talks on phone: Not on file    Gets together: Not on file    Attends religious service: Not on file    Active member of club or organization: Not on file    Attends meetings of clubs or organizations: Not on file    Relationship status: Not on file  . Intimate partner violence:    Fear of current or ex partner: Not on file    Emotionally abused: Not on file    Physically abused: Not on file    Forced sexual activity: Not on file  Other Topics Concern  . Not on file  Social History Narrative   Lives at home with her husband.   Right-handed.   1-2 cups caffeine per day.     PHYSICAL EXAM   Vitals:   03/26/17 1535  BP: 110/61  Pulse: (!) 57  Weight: 123 lb (55.8 kg)  Height: 5\' 3"  (1.6 m)    Not recorded    Blood pressure lying down 161/63 heart rate of 57, sitting up 146/70 heart rate of 50, standing 160/74 heart rate of 54, standing for 3 minutes 146/69 heart rate of 54,  Body mass index is 21.79 kg/m.  PHYSICAL  EXAMNIATION:  Gen: NAD, conversant, well nourised, obese, well groomed                     Cardiovascular: Regular rate rhythm, no peripheral edema, warm, nontender. Eyes: Conjunctivae clear without exudates or hemorrhage Neck: Supple, no carotid bruits. Pulmonary: Clear to auscultation bilaterally   NEUROLOGICAL EXAM:  MENTAL STATUS: Speech:    Speech is normal; fluent and spontaneous with normal comprehension.  Cognition:     Orientation to time, place and person     Normal recent and remote memory     Normal Attention span and concentration     Normal Language, naming, repeating,spontaneous speech     Fund of knowledge   CRANIAL NERVES: CN II: Visual fields are full to confrontation. Fundoscopic exam is normal with sharp discs and no vascular changes. Pupils are round equal and briskly reactive to light. CN III, IV, VI: extraocular movement are normal. No ptosis. CN V: Facial sensation is intact to pinprick in all 3 divisions bilaterally. Corneal responses are intact.  CN VII: Face is symmetric with normal eye closure and smile. CN VIII: Hearing is normal to rubbing fingers CN IX, X: Palate elevates symmetrically. Phonation is normal. CN XI: Head turning and shoulder shrug are intact CN XII: Tongue is midline with normal movements and no atrophy.  MOTOR: There is no pronator drift of out-stretched arms. Muscle bulk and tone are normal. Muscle strength is normal.  REFLEXES: Reflexes are 2+ and symmetric at the biceps, triceps, knees, and ankles. Plantar responses are flexor.  SENSORY: Intact to light touch, pinprick, positional sensation and vibratory sensation are intact in fingers and toes.  COORDINATION: Rapid alternating movements and fine finger movements are intact. There is no dysmetria on finger-to-nose and heel-knee-shin.    GAIT/STANCE: Posture is normal, steady   DIAGNOSTIC DATA (LABS, IMAGING, TESTING) - I reviewed patient records, labs, notes, testing and  imaging myself where available.   ASSESSMENT AND PLAN  Nilda CalamityJudy Hobin is a 77 y.o. female  Sudden onset loss of consciousness,  Significant abnormal findings on MRI of the brain,  Most consistent with complex partial seizure  She complains of moodiness with Keppra, will switch to titrating dose of lamotrigine 150 mg twice a day    No driving until seizure free for 6 months  History of carotid arteries stenosis, and stroke  Has vascular risk factor of hypertension, hyperlipidemia,  Continue aspirin 81 mg daily  MRI of the neck in October 2018 showed left internal carotid artery 40% stenosis, no significant stenosis of right internal carotid artery, no significant bilateral vertebral artery stenosis.   Arrhythmia  Cardiac monitoring July 2018 showed underlying sinus rhythm, with intermittent episode of ventricular tachycardia, supraventricular tachycardia, some of the supraventricular tachycardia or possible atrial tachycardia with variable block, ventricular trigeminy was present  Levert Feinstein, M.D. Ph.D.  [facility] [address] [place], [place] [address] Ph: [phone] Fax: [phone]  CC: Marylen Ponto, MD

## 2017-03-26 NOTE — Patient Instructions (Signed)
week Keppra 500mg  Lamotrigine 25mg   1st 1/1 1/1  2nd 1/1 2/2  3rd 0/1 3/3  4th 0/0 4/4    Lamotrigine 150mg  1/1

## 2017-03-28 ENCOUNTER — Encounter: Payer: Self-pay | Admitting: Neurology

## 2017-04-04 ENCOUNTER — Telehealth: Payer: Self-pay | Admitting: Neurology

## 2017-04-04 ENCOUNTER — Telehealth: Payer: Self-pay | Admitting: *Deleted

## 2017-04-04 ENCOUNTER — Other Ambulatory Visit: Payer: Self-pay | Admitting: Cardiology

## 2017-04-04 NOTE — Telephone Encounter (Signed)
error 

## 2017-04-04 NOTE — Telephone Encounter (Signed)
Received a call from the pharmacy today that there was concern about the patient's Keppra and Lamictal prescriptions (spoke to [place], Teacher, early years/prepharmacist, [facility]).  The patient's husband came into the pharmacy today with paperwork from our office. Neysa BonitoChristy said he never brought the printed prescription in for Lamictal 25mg  (it was w/ the paperwork).  However, he had already picked up the prescription for Lamictal 150mg  on 03/26/17 (150mg  was on file to start after titration).  After further investigation, it was determined that her husband was titrating her Lamictal dosage with 150mg  tablets rather than 25mg  tablets.  He was just starting her second week of titration and gave her Lamictal 300mg  this morning (he had been giving her 150mg  BID for the last week).  This was in addition to her Keppra 500mg  twice daily.  Per vo by Dr. Terrace ArabiaYan, do the following:  1) Increase Keppra back to 1000mg  twice daily. 2) Stop Lamictal for one week 3) Restart the Keppra decrease and Lamictal titration plan with the 25mg  tablets in one week (as directed in last office note).  The dosing schedule was reviewed with pharmacist and patient's husband.  The complete plan above was discussed in detail with both the patient's husband and pharmacist, Neysa Bonitohristy.  Both verbalized understanding.  Provided our number to call with any questions.  The patient was said to be in a good state of health with the exception of nausea and diarrhea. They were instructed to call us with further concerns.

## 2017-05-21 NOTE — Telephone Encounter (Addendum)
I called back again and it rang repeatedly with no answer or machine.

## 2017-05-21 NOTE — Telephone Encounter (Signed)
Called again and was able to speak to the patient.  She is still feeling nauseated and states she has been since starting Lamictal.  She was unable to confirm her doses and said I needed to speak with her husband.  He was at the pharmacy and unavailable.  I will call him back in the morning to clarify how she is taking Lamictal and Keppra.  Per vo by Dr. Terrace Arabia, if the nausea has been persistent, then she would like to check labs.

## 2017-05-21 NOTE — Telephone Encounter (Signed)
Attempted to return call twice - the line is busy.  I will try again today.

## 2017-05-21 NOTE — Telephone Encounter (Addendum)
Pt's husband called said she is nauseated and is wondering if he is giving the medication correctly. He said he can't see very well. He mentioned about giving Lamictal  bid. I asked him if was aware of "pill pack", he said no but it sounded like a good idea. Please call to advise at [phone]

## 2017-05-22 ENCOUNTER — Encounter: Payer: Self-pay | Admitting: *Deleted

## 2017-05-22 NOTE — Telephone Encounter (Signed)
Pt husband(on DPR) has called RN Marcelino Duster back.  Pt husband is asking for a call when RN Marcelino Duster is available

## 2017-05-22 NOTE — Telephone Encounter (Signed)
Spoke to patient's husband.  States his wife is feeling better.  Intermittent nausea has been a chronic problem for her that typically worsens after she eats.  I told him she should see her PCP for further evaluation.  I reviewed her current medications with him.  He confirmed that she is now off Keppra and only taking Lamictal , BID.  He feels she is doing better - no passing out events reported.  She will keep her pending appt in June with Dr. Terrace Arabia and call us prior to then with any questions or concerns.

## 2017-06-24 DIAGNOSIS — M545 Low back pain: Secondary | ICD-10-CM | POA: Diagnosis not present

## 2017-06-24 DIAGNOSIS — I2510 Atherosclerotic heart disease of native coronary artery without angina pectoris: Secondary | ICD-10-CM | POA: Diagnosis not present

## 2017-06-24 DIAGNOSIS — R079 Chest pain, unspecified: Secondary | ICD-10-CM | POA: Diagnosis not present

## 2017-06-24 DIAGNOSIS — Z951 Presence of aortocoronary bypass graft: Secondary | ICD-10-CM | POA: Diagnosis not present

## 2017-06-24 DIAGNOSIS — R0789 Other chest pain: Secondary | ICD-10-CM | POA: Diagnosis not present

## 2017-06-24 DIAGNOSIS — Z87891 Personal history of nicotine dependence: Secondary | ICD-10-CM | POA: Diagnosis not present

## 2017-06-24 DIAGNOSIS — S39012A Strain of muscle, fascia and tendon of lower back, initial encounter: Secondary | ICD-10-CM | POA: Diagnosis not present

## 2017-06-24 DIAGNOSIS — I10 Essential (primary) hypertension: Secondary | ICD-10-CM | POA: Diagnosis not present

## 2017-06-24 DIAGNOSIS — Z8673 Personal history of transient ischemic attack (TIA), and cerebral infarction without residual deficits: Secondary | ICD-10-CM | POA: Diagnosis not present

## 2017-06-26 ENCOUNTER — Encounter: Payer: Self-pay | Admitting: Neurology

## 2017-06-26 ENCOUNTER — Ambulatory Visit (INDEPENDENT_AMBULATORY_CARE_PROVIDER_SITE_OTHER): Payer: Medicare Other | Admitting: Neurology

## 2017-06-26 VITALS — BP 141/64 | HR 58 | Ht 68.0 in | Wt 117.5 lb

## 2017-06-26 DIAGNOSIS — I6522 Occlusion and stenosis of left carotid artery: Secondary | ICD-10-CM | POA: Diagnosis not present

## 2017-06-26 DIAGNOSIS — R569 Unspecified convulsions: Secondary | ICD-10-CM | POA: Diagnosis not present

## 2017-06-26 MED ORDER — LAMOTRIGINE 150 MG PO TABS: ORAL_TABLET | ORAL | 4 refills | Status: DC

## 2017-06-26 NOTE — Progress Notes (Signed)
PATIENT: Susan [name] DOB: 1940/07/29  Chief Complaint  Patient presents with  . Seizures    She is here with her husband, Donnie. Says she is taking Lamictal 150mg , one tablet BID.  Reports seizure-like activity on 05/24/17.  No missed doses of medications.     HISTORICAL  Susan [name] 77 year old female, accompanied by her husband Donnie, seen in refer by Dr. Leonor Liv, Shea Evans, for evaluation of passing out episode,Initial evaluation was on September 20 2016  I have reviewed and summarized referring note, she reported a history of stroke, carotid artery occlusion, but could not elaborate on detail, she did reported a history of endarterectomy in the past, she also has history of hypertension, hyperlipidemia, coronary artery disease, most recent cardiac catheter in August 2018 showed multi vessels significant stenosis, saphenous graft was patent, ejection fraction was 55-65%, there was no wall region motion abnormality,  She is on aspirin 81 mg daily, she presented with 4 episode of passing out since June 2018,  First episode, she was sitting across the table with her husband, without warning signs, she slumped over, had transient loss of consciousness,  Another episode in July 2018 with her [name], sudden onset loss of consciousness, with loss of bowel and bladder control,  Most recent episode of passing out was on September 16 2016, she was sitting on the couch talking with her friend, she felt dizzy, had a few cough, then had sudden loss of consciousness, she was found by her husband days into space, unresponsive for a few minutes, she denies heart palpitation before symptoms onset  I was able to review cardiac monitoring result from June 25 to July 09 2016 by Dr. Tomie China, minimum heart rate of 41, maximum was 158,  One run of ventricular tachycardia, with maximum heart rate of 130 bpm, episode of supraventricular tachycardia    UPDATE March 26 2017: She is  accompanied by her husband at today's clinical visit, she was admitted to the hospital on March 18, 2017, she was with her husband at her cardiologist's office, had a witnessed sudden onset loss of consciousness, seizure-like activity, was sent to the emergency room, patient reported in the morning of the office visit, she did not feel well, just recovering from her cold, which has lingered around for 10 days, she was found diaphoretic, not responsive, there was urinary incontinence, body shaking episode, she has mild post event confusion, woke up in the elevator confused, has no recollection of the event,  Husband reported that this is the fourth similar episode over the past 8 years, this episode in March 2019 happened while she was taking Keppra 250 mg twice a day, the dose was increased to 1000 mg twice a day, initially she complains of lightheadedness and dizziness, now she tolerated well, but today she complains of feeling anxious, afraid to leave home, feeling embarrassed worried about incontinence sitting at church,  MRA of neck showed left internal carotid artery 40% stenosis, MRAof the brain showed no significant abnormality   MRI of the brain without contrast shows the following: Intensive small vessel disease involving bilateral hemispheres, pons segments, significantly progressed compared to 2009, moderate cortical atrophy, also progress, chronic microhemorrhage in the left frontal lobe, chronic lacunar infarction involving left basal ganglion, adjacent internal capsule and periventricular white matter.  She was noted to have another minor spell in March 24, sitting there, suddenly broke out in sweat, no loss of consciousness,   EEG was normal in October 2018  UPDATE  June 25 2017: She was started on lamotrigine 150 mg twice a day, in April 2019, titration schedule, she has been on stable dose of lamotrigine since, has been reported one episode of passing out on May 24, 2017, he was driving  her to beauty shop, she had a transient slumped over loss of consciousness, but there was no bowel and bladder incontinence, she woke up had no recollection of the event, but was able to continue the rest of the day,  She has not seen her cardiologist for a while, based on record, last visit was in July 2017.  REVIEW OF SYSTEMS: Full 14 system review of systems performed and notable only for as above ALLERGIES: Allergies  Allergen Reactions  . Boniva [Ibandronic Acid] Shortness Of Breath  . Crestor [Rosuvastatin Calcium]     Weakness   . Fenofibrate     Muscle pain  . Vytorin [Ezetimibe-Simvastatin]     Weakness  . Welchol [Colesevelam Hcl]     Weakness  . Zetia [Ezetimibe]     Weakness     HOME MEDICATIONS: Current Outpatient Medications  Medication Sig Dispense Refill  . acetaminophen (TYLENOL) 500 MG tablet Take 1,000 mg by mouth every 6 (six) hours as needed (for pain.).    Marland Kitchen aspirin 81 MG tablet Take 81 mg by mouth daily.     . carvedilol (COREG) 6.25 MG tablet Take 6.25 mg by mouth 2 (two) times daily with a meal.    . cyanocobalamin 1000 MCG tablet Take 1,000 mcg by mouth daily.    . cyclobenzaprine (FLEXERIL) 5 MG tablet Take 5 mg by mouth 3 (three) times daily as needed for muscle spasms.    Marland Kitchen donepezil (ARICEPT) 10 MG tablet Take 10 mg by mouth at bedtime.    . isosorbide mononitrate (IMDUR) 30 MG 24 hr tablet TAKE ONE TABLET BY MOUTH DAILY 90 tablet 3  . lamoTRIgine (LAMICTAL) 150 MG tablet Take 1 tablet (150 mg total) by mouth 2 (two) times daily. 60 tablet 11  . Omega-3-acid Ethyl Esters (LOVAZA PO) Take 1,000 mg by mouth daily.    Marland Kitchen omeprazole (PRILOSEC) 40 MG capsule Take 30 mg by mouth daily.  1  . pravastatin (PRAVACHOL) 40 MG tablet Take 40 mg by mouth daily.    . ramipril (ALTACE) 5 MG capsule Take 5 mg by mouth daily.      No current facility-administered medications for this visit.     PAST MEDICAL HISTORY: Past Medical History:  Diagnosis Date  .  Arthritis   . Carotid artery occlusion   . GERD (gastroesophageal reflux disease)   . Hyperlipidemia   . Hypertension   . Occlusion of carotid artery 09/20/2016  . Seizures (HCC)   . Stroke (HCC) 2005  . Syncope     PAST SURGICAL HISTORY: Past Surgical History:  Procedure Laterality Date  . ABDOMINAL HYSTERECTOMY    . CAROTID ENDARTERECTOMY    . CORONARY ARTERY BYPASS GRAFT    . LEFT HEART CATH AND CORS/GRAFTS ANGIOGRAPHY N/A 08/02/2016   Procedure: Left Heart Cath and Cors/Grafts Angiography;  Surgeon: Runell Gess, MD;  Location: [facility] INVASIVE CV LAB;  Service: Cardiovascular;  Laterality: N/A;    FAMILY HISTORY: Family History  Problem Relation Age of Onset  . Heart attack Mother   . Heart disease Father   . Heart attack Father     SOCIAL HISTORY:  Social History   Socioeconomic History  . Marital status: Married    Spouse name: Not  on file  . Number of children: 1  . Years of education: 10th grade  . Highest education level: Not on file  Occupational History  . Occupation: Retired  Engineer, productionocial Needs  . Financial resource strain: Not on file  . Food insecurity:    Worry: Not on file    Inability: Not on file  . Transportation needs:    Medical: Not on file    Non-medical: Not on file  Tobacco Use  . Smoking status: Former Smoker    Packs/day: 0.50    Years: 20.00    Pack years: 10.00    Types: Cigarettes    Last attempt to quit: 03/07/1981    Years since quitting: 36.3  . Smokeless tobacco: Never Used  Substance and Sexual Activity  . Alcohol use: No  . Drug use: No  . Sexual activity: Not on file  Lifestyle  . Physical activity:    Days per week: Not on file    Minutes per session: Not on file  . Stress: Not on file  Relationships  . Social connections:    Talks on phone: Not on file    Gets together: Not on file    Attends religious service: Not on file    Active member of club or organization: Not on file    Attends meetings of clubs or  organizations: Not on file    Relationship status: Not on file  . Intimate partner violence:    Fear of current or ex partner: Not on file    Emotionally abused: Not on file    Physically abused: Not on file    Forced sexual activity: Not on file  Other Topics Concern  . Not on file  Social History Narrative   Lives at home with her husband.   Right-handed.   1-2 cups caffeine per day.     PHYSICAL EXAM   Vitals:   06/26/17 1009  BP: (!) 141/64  Pulse: (!) 58  Weight: 117 lb 8 oz (53.3 kg)  Height: 5\' 8"  (1.727 m)    Not recorded    Blood pressure lying down 161/63 heart rate of 57, sitting up 146/70 heart rate of 50, standing 160/74 heart rate of 54, standing for 3 minutes 146/69 heart rate of 54,  Body mass index is 17.87 kg/m.  PHYSICAL EXAMNIATION:  Gen: NAD, conversant, well nourised, obese, well groomed                     Cardiovascular: Regular rate rhythm, no peripheral edema, warm, nontender. Eyes: Conjunctivae clear without exudates or hemorrhage Neck: Supple, no carotid bruits. Pulmonary: Clear to auscultation bilaterally   NEUROLOGICAL EXAM:  MENTAL STATUS: Speech:    Speech is normal; fluent and spontaneous with normal comprehension.  Cognition:     Orientation to time, place and person     Normal recent and remote memory     Normal Attention span and concentration     Normal Language, naming, repeating,spontaneous speech     Fund of knowledge   CRANIAL NERVES: CN II: Visual fields are full to confrontation. Fundoscopic exam is normal with sharp discs and no vascular changes. Pupils are round equal and briskly reactive to light. CN III, IV, VI: extraocular movement are normal. No ptosis. CN V: Facial sensation is intact to pinprick in all 3 divisions bilaterally. Corneal responses are intact.  CN VII: Face is symmetric with normal eye closure and smile. CN VIII: Hearing is normal to  rubbing fingers CN IX, X: Palate elevates symmetrically.  Phonation is normal. CN XI: Head turning and shoulder shrug are intact CN XII: Tongue is midline with normal movements and no atrophy.  MOTOR: There is no pronator drift of out-stretched arms. Muscle bulk and tone are normal. Muscle strength is normal.  REFLEXES: Reflexes are 2+ and symmetric at the biceps, triceps, knees, and ankles. Plantar responses are flexor.  SENSORY: Intact to light touch, pinprick, positional sensation and vibratory sensation are intact in fingers and toes.  COORDINATION: Rapid alternating movements and fine finger movements are intact. There is no dysmetria on finger-to-nose and heel-knee-shin.    GAIT/STANCE: Posture is normal, steady   DIAGNOSTIC DATA (LABS, IMAGING, TESTING) - I reviewed patient records, labs, notes, testing and imaging myself where available.   ASSESSMENT AND PLAN  Kalasia Crafton is a 77 y.o. 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   Arrhythmia  Cardiac monitoring July 2018 showed underlying sinus rhythm, with intermittent episode of ventricular tachycardia, supraventricular tachycardia, some of the supraventricular tachycardia or possible atrial tachycardia with variable block, ventricular trigeminy  was present  Recurrent spells of sudden loss of consciousness, despite taking lamotrigine 150 mg twice a day, I could not rule out possibility of cardiac syncope based on history, but patient and her husband reported she had one episode to her cardiologist office, was reported as seizure-like activity, not cardiac event.  If she continue have recurrent spells on higher dose of lamotrigine 150 mg / 225 mg every night, may consider loop recorder,  Check lamotrigine level today  Levert Feinstein, M.D. Ph.D.  [facility] [address] [place], [place] [address] Ph: [phone] Fax: [phone]  CC: Marylen Ponto, MD

## 2017-06-27 ENCOUNTER — Telehealth: Payer: Self-pay | Admitting: Neurology

## 2017-06-27 NOTE — Telephone Encounter (Addendum)
Please call patient, mild worsening creatinine level 1.24, GFR of 42,  Encourage her to increase water intake,   Lamotrigine level is 10.6 within therapeutic levels

## 2017-06-28 ENCOUNTER — Encounter: Payer: Self-pay | Admitting: *Deleted

## 2017-06-28 LAB — CBC WITH DIFFERENTIAL/PLATELET
BASOS ABS: 0.1 10*3/uL (ref 0.0–0.2)
BASOS: 1 %
EOS (ABSOLUTE): 0.4 10*3/uL (ref 0.0–0.4)
Eos: 5 %
HEMOGLOBIN: 12.5 g/dL (ref 11.1–15.9)
Hematocrit: 39.7 % (ref 34.0–46.6)
Immature Grans (Abs): 0 10*3/uL (ref 0.0–0.1)
Immature Granulocytes: 0 %
LYMPHS ABS: 2.4 10*3/uL (ref 0.7–3.1)
Lymphs: 30 %
MCH: 30.3 pg (ref 26.6–33.0)
MCHC: 31.5 g/dL (ref 31.5–35.7)
MCV: 96 fL (ref 79–97)
MONOCYTES: 11 %
Monocytes Absolute: 0.9 10*3/uL (ref 0.1–0.9)
NEUTROS ABS: 4.3 10*3/uL (ref 1.4–7.0)
Neutrophils: 53 %
Platelets: 277 10*3/uL (ref 150–450)
RBC: 4.12 x10E6/uL (ref 3.77–5.28)
RDW: 14 % (ref 12.3–15.4)
WBC: 8 10*3/uL (ref 3.4–10.8)

## 2017-06-28 LAB — COMPREHENSIVE METABOLIC PANEL
A/G RATIO: 1.7 (ref 1.2–2.2)
ALK PHOS: 70 IU/L (ref 39–117)
ALT: 9 IU/L (ref 0–32)
AST: 14 IU/L (ref 0–40)
Albumin: 4.3 g/dL (ref 3.5–4.8)
BILIRUBIN TOTAL: 0.4 mg/dL (ref 0.0–1.2)
BUN/Creatinine Ratio: 11 — ABNORMAL LOW (ref 12–28)
BUN: 14 mg/dL (ref 8–27)
CALCIUM: 9 mg/dL (ref 8.7–10.3)
CHLORIDE: 106 mmol/L (ref 96–106)
CO2: 23 mmol/L (ref 20–29)
Creatinine, Ser: 1.24 mg/dL — ABNORMAL HIGH (ref 0.57–1.00)
GFR calc Af Amer: 48 mL/min/{1.73_m2} — ABNORMAL LOW (ref >59)
GFR calc non Af Amer: 42 mL/min/{1.73_m2} — ABNORMAL LOW (ref >59)
Globulin, Total: 2.6 g/dL (ref 1.5–4.5)
Glucose: 100 mg/dL — ABNORMAL HIGH (ref 65–99)
POTASSIUM: 5 mmol/L (ref 3.5–5.2)
SODIUM: 142 mmol/L (ref 134–144)
Total Protein: 6.9 g/dL (ref 6.0–8.5)

## 2017-06-28 LAB — LAMOTRIGINE LEVEL: Lamotrigine Lvl: 10.6 ug/mL (ref 2.0–20.0)

## 2017-06-28 NOTE — Telephone Encounter (Signed)
Spoke to patient - she is aware of her lab results and agreeable to increase water intake.

## 2017-07-15 DIAGNOSIS — E559 Vitamin D deficiency, unspecified: Secondary | ICD-10-CM | POA: Diagnosis not present

## 2017-07-15 DIAGNOSIS — I2510 Atherosclerotic heart disease of native coronary artery without angina pectoris: Secondary | ICD-10-CM | POA: Diagnosis not present

## 2017-07-15 DIAGNOSIS — R5383 Other fatigue: Secondary | ICD-10-CM | POA: Diagnosis not present

## 2017-07-15 DIAGNOSIS — I10 Essential (primary) hypertension: Secondary | ICD-10-CM | POA: Diagnosis not present

## 2017-07-15 DIAGNOSIS — R5381 Other malaise: Secondary | ICD-10-CM | POA: Diagnosis not present

## 2017-07-15 DIAGNOSIS — K219 Gastro-esophageal reflux disease without esophagitis: Secondary | ICD-10-CM | POA: Diagnosis not present

## 2017-07-15 DIAGNOSIS — R252 Cramp and spasm: Secondary | ICD-10-CM | POA: Diagnosis not present

## 2017-07-15 DIAGNOSIS — E782 Mixed hyperlipidemia: Secondary | ICD-10-CM | POA: Diagnosis not present

## 2017-07-15 DIAGNOSIS — G40909 Epilepsy, unspecified, not intractable, without status epilepticus: Secondary | ICD-10-CM | POA: Diagnosis not present

## 2017-07-15 DIAGNOSIS — Z8673 Personal history of transient ischemic attack (TIA), and cerebral infarction without residual deficits: Secondary | ICD-10-CM | POA: Diagnosis not present

## 2017-07-16 DIAGNOSIS — E559 Vitamin D deficiency, unspecified: Secondary | ICD-10-CM | POA: Insufficient documentation

## 2017-07-16 DIAGNOSIS — R252 Cramp and spasm: Secondary | ICD-10-CM | POA: Insufficient documentation

## 2017-07-16 DIAGNOSIS — R5383 Other fatigue: Secondary | ICD-10-CM

## 2017-07-16 DIAGNOSIS — K219 Gastro-esophageal reflux disease without esophagitis: Secondary | ICD-10-CM | POA: Insufficient documentation

## 2017-07-16 DIAGNOSIS — R5381 Other malaise: Secondary | ICD-10-CM | POA: Insufficient documentation

## 2017-07-21 DIAGNOSIS — G40909 Epilepsy, unspecified, not intractable, without status epilepticus: Secondary | ICD-10-CM | POA: Diagnosis not present

## 2017-07-21 DIAGNOSIS — R569 Unspecified convulsions: Secondary | ICD-10-CM | POA: Diagnosis not present

## 2017-07-23 DIAGNOSIS — I10 Essential (primary) hypertension: Secondary | ICD-10-CM | POA: Diagnosis not present

## 2017-07-23 DIAGNOSIS — G40909 Epilepsy, unspecified, not intractable, without status epilepticus: Secondary | ICD-10-CM | POA: Diagnosis not present

## 2017-07-23 DIAGNOSIS — E860 Dehydration: Secondary | ICD-10-CM | POA: Diagnosis not present

## 2017-07-30 DIAGNOSIS — E538 Deficiency of other specified B group vitamins: Secondary | ICD-10-CM | POA: Diagnosis not present

## 2017-07-30 DIAGNOSIS — G459 Transient cerebral ischemic attack, unspecified: Secondary | ICD-10-CM | POA: Diagnosis not present

## 2017-07-30 DIAGNOSIS — G3184 Mild cognitive impairment, so stated: Secondary | ICD-10-CM | POA: Diagnosis not present

## 2017-07-30 DIAGNOSIS — G40201 Localization-related (focal) (partial) symptomatic epilepsy and epileptic syndromes with complex partial seizures, not intractable, with status epilepticus: Secondary | ICD-10-CM | POA: Diagnosis not present

## 2017-07-30 DIAGNOSIS — E559 Vitamin D deficiency, unspecified: Secondary | ICD-10-CM | POA: Diagnosis not present

## 2017-07-30 DIAGNOSIS — E531 Pyridoxine deficiency: Secondary | ICD-10-CM | POA: Diagnosis not present

## 2017-07-30 DIAGNOSIS — Z79899 Other long term (current) drug therapy: Secondary | ICD-10-CM | POA: Diagnosis not present

## 2017-09-19 DIAGNOSIS — G459 Transient cerebral ischemic attack, unspecified: Secondary | ICD-10-CM | POA: Diagnosis not present

## 2017-09-19 DIAGNOSIS — G40201 Localization-related (focal) (partial) symptomatic epilepsy and epileptic syndromes with complex partial seizures, not intractable, with status epilepticus: Secondary | ICD-10-CM | POA: Diagnosis not present

## 2017-09-19 DIAGNOSIS — G3184 Mild cognitive impairment, so stated: Secondary | ICD-10-CM | POA: Diagnosis not present

## 2017-10-04 DIAGNOSIS — Z23 Encounter for immunization: Secondary | ICD-10-CM | POA: Diagnosis not present

## 2017-10-07 ENCOUNTER — Telehealth: Payer: Self-pay | Admitting: Neurology

## 2017-10-07 ENCOUNTER — Other Ambulatory Visit: Payer: Self-pay | Admitting: Neurology

## 2017-10-07 NOTE — Telephone Encounter (Signed)
Returned call to [place] at the pharmacy who is updating this patient's profile.  She wanted to verify the patient's current Lamictal dosage and be sure she is no longer on Keppra.  She is aware Keppra was discontinued at her appt in June 2019 due to moodiness.  She should only be taking Lamical 150mg , 1 tab in am and 1.5 tabs in pm.

## 2017-10-07 NOTE — Telephone Encounter (Signed)
Susan [name] with [facility] calling to discuss patient discontinuing Keppra. Is there another drug she is taking in place of Keppra?

## 2017-10-11 DIAGNOSIS — G40909 Epilepsy, unspecified, not intractable, without status epilepticus: Secondary | ICD-10-CM | POA: Diagnosis not present

## 2017-11-20 DIAGNOSIS — K2270 Barrett's esophagus without dysplasia: Secondary | ICD-10-CM | POA: Diagnosis not present

## 2017-11-20 DIAGNOSIS — K219 Gastro-esophageal reflux disease without esophagitis: Secondary | ICD-10-CM | POA: Diagnosis not present

## 2017-12-02 ENCOUNTER — Ambulatory Visit: Payer: Medicare Other | Admitting: Neurology

## 2017-12-10 DIAGNOSIS — G40201 Localization-related (focal) (partial) symptomatic epilepsy and epileptic syndromes with complex partial seizures, not intractable, with status epilepticus: Secondary | ICD-10-CM | POA: Diagnosis not present

## 2017-12-10 DIAGNOSIS — G459 Transient cerebral ischemic attack, unspecified: Secondary | ICD-10-CM | POA: Diagnosis not present

## 2017-12-10 DIAGNOSIS — G3184 Mild cognitive impairment, so stated: Secondary | ICD-10-CM | POA: Diagnosis not present

## 2017-12-21 ENCOUNTER — Other Ambulatory Visit: Payer: Self-pay | Admitting: Cardiology

## 2018-01-16 ENCOUNTER — Ambulatory Visit: Payer: Medicare Other | Admitting: Cardiology

## 2018-01-29 ENCOUNTER — Ambulatory Visit: Payer: Medicare Other | Admitting: Cardiology

## 2018-02-14 ENCOUNTER — Ambulatory Visit (INDEPENDENT_AMBULATORY_CARE_PROVIDER_SITE_OTHER): Payer: Medicare Other | Admitting: Cardiology

## 2018-02-14 ENCOUNTER — Encounter: Payer: Self-pay | Admitting: Cardiology

## 2018-02-14 VITALS — BP 124/70 | HR 57 | Ht 64.0 in | Wt 116.0 lb

## 2018-02-14 DIAGNOSIS — I25119 Atherosclerotic heart disease of native coronary artery with unspecified angina pectoris: Secondary | ICD-10-CM

## 2018-02-14 DIAGNOSIS — Z8673 Personal history of transient ischemic attack (TIA), and cerebral infarction without residual deficits: Secondary | ICD-10-CM

## 2018-02-14 DIAGNOSIS — Z951 Presence of aortocoronary bypass graft: Secondary | ICD-10-CM | POA: Diagnosis not present

## 2018-02-14 DIAGNOSIS — E782 Mixed hyperlipidemia: Secondary | ICD-10-CM

## 2018-02-14 DIAGNOSIS — I10 Essential (primary) hypertension: Secondary | ICD-10-CM | POA: Diagnosis not present

## 2018-02-14 NOTE — Patient Instructions (Signed)
Medication Instructions:  Your physician recommends that you continue on your current medications as directed. Please refer to the Current Medication list given to you today.  If you need a refill on your cardiac medications before your next appointment, please call your pharmacy.   Lab work: Your physician recommends that you return for lab work today: bmp ,cbc, tsh, lft, and lipids   If you have labs (blood work) drawn today and your tests are completely normal, you will receive your results only by: Marland Kitchen MyChart Message (if you have MyChart) OR . A paper copy in the mail If you have any lab test that is abnormal or we need to change your treatment, we will call you to review the results.  Testing/Procedures: None.   Follow-Up: At [facility], you and your health needs are our priority.  As part of our continuing mission to provide you with exceptional heart care, we have created designated Provider Care Teams.  These Care Teams include your primary Cardiologist (physician) and Advanced Practice Providers (APPs -  Physician Assistants and Nurse Practitioners) who all work together to provide you with the care you need, when you need it. You will need a follow up appointment in 6 months.  Please call our office 2 months in advance to schedule this appointment.  You may see No primary care provider on file. or another member of our [employer] in [place]: Gypsy Balsam, MD . Norman Herrlich, MD   Any Other Special Instructions Will Be Listed Below (If Applicable).

## 2018-02-14 NOTE — Progress Notes (Signed)
Cardiology Office Note:    Date:  02/14/2018   ID:  Susan [name], DOB 01-24-1940, MRN [id number]  PCP:  Marylen Ponto, MD  Cardiologist:  Garwin Brothers, MD   Referring MD: Marylen Ponto, MD    ASSESSMENT:    1. Coronary artery disease involving native coronary artery of native heart with angina pectoris (HCC)   2. Essential hypertension   3. Mixed hyperlipidemia   4. H/O: CVA (cerebrovascular accident)   5. Hx of CABG   6. Mixed dyslipidemia    PLAN:    In order of problems listed above:  1. Secondary prevention stressed with the patient.  Importance of compliance with diet and medication stressed and she vocalized understanding.  Her blood pressure is stable.  Diet was discussed for essential hypertension and dyslipidemia. 2. She will have blood work today and I will check her lipids also.  She will need to be initiated on a statin therapy. 3. Patient will be seen in follow-up appointment in 6 months or earlier if the patient has any concerns    Medication Adjustments/Labs and Tests Ordered: Current medicines are reviewed at length with the patient today.  Concerns regarding medicines are outlined above.  No orders of the defined types were placed in this encounter.  No orders of the defined types were placed in this encounter.    Chief Complaint  Patient presents with  . Follow-up     History of Present Illness:    Susan [name] is a 78 y.o. female.  Patient has known coronary artery disease and denies any problems at this time.  She takes care of activities of daily living.  No chest pain orthopnea or PND.  She has not had a seizure since the past several months.  She leads a sedentary lifestyle.  It is not clear to me why she is not on a statin therapy.  Past Medical History:  Diagnosis Date  . Arthritis   . Carotid artery occlusion   . GERD (gastroesophageal reflux disease)   . Hyperlipidemia   . Hypertension   . Occlusion of carotid artery 09/20/2016  .  Stroke (HCC) 2005  . Syncope     Past Surgical History:  Procedure Laterality Date  . ABDOMINAL HYSTERECTOMY    . CAROTID ENDARTERECTOMY    . CORONARY ARTERY BYPASS GRAFT    . LEFT HEART CATH AND CORS/GRAFTS ANGIOGRAPHY N/A 08/02/2016   Procedure: Left Heart Cath and Cors/Grafts Angiography;  Surgeon: Runell Gess, MD;  Location: [facility] INVASIVE CV LAB;  Service: Cardiovascular;  Laterality: N/A;    Current Medications: Current Meds  Medication Sig  . acetaminophen (TYLENOL) 500 MG tablet Take 1,000 mg by mouth every 6 (six) hours as needed (for pain.).  Marland Kitchen aspirin 81 MG tablet Take 81 mg by mouth daily.   . carvedilol (COREG) 6.25 MG tablet Take 6.25 mg by mouth 2 (two) times daily with a meal.  . cyanocobalamin 1000 MCG tablet Take 1,000 mcg by mouth daily.  . cyclobenzaprine (FLEXERIL) 5 MG tablet Take 5 mg by mouth 3 (three) times daily as needed for muscle spasms.  Marland Kitchen donepezil (ARICEPT) 10 MG tablet Take 10 mg by mouth at bedtime.  . isosorbide mononitrate (IMDUR) 30 MG 24 hr tablet TAKE ONE TABLET BY MOUTH DAILY  . lamoTRIgine (LAMICTAL) 150 MG tablet One in the morning, one and 1/2 tabs at night  . omeprazole (PRILOSEC) 40 MG capsule Take 30 mg by mouth daily.  Marland Kitchen omeprazole (  PRILOSEC) 40 MG capsule Take 40 mg by mouth daily.  . ramipril (ALTACE) 5 MG capsule TAKE ONE CAPSULE BY MOUTH ONCE DAILY     Allergies:   Boniva [ibandronic acid]; Crestor [rosuvastatin calcium]; Fenofibrate; Vytorin [ezetimibe-simvastatin]; Welchol [colesevelam hcl]; and Zetia [ezetimibe]   Social History   Socioeconomic History  . Marital status: Married    Spouse name: Not on file  . Number of children: 1  . Years of education: 10th grade  . Highest education level: Not on file  Occupational History  . Occupation: Retired  Engineer, production  . Financial resource strain: Not on file  . Food insecurity:    Worry: Not on file    Inability: Not on file  . Transportation needs:    Medical: Not on  file    Non-medical: Not on file  Tobacco Use  . Smoking status: Former Smoker    Packs/day: 0.50    Years: 20.00    Pack years: 10.00    Types: Cigarettes    Last attempt to quit: 03/07/1981    Years since quitting: 36.9  . Smokeless tobacco: Never Used  Substance and Sexual Activity  . Alcohol use: No  . Drug use: No  . Sexual activity: Not on file  Lifestyle  . Physical activity:    Days per week: Not on file    Minutes per session: Not on file  . Stress: Not on file  Relationships  . Social connections:    Talks on phone: Not on file    Gets together: Not on file    Attends religious service: Not on file    Active member of club or organization: Not on file    Attends meetings of clubs or organizations: Not on file    Relationship status: Not on file  Other Topics Concern  . Not on file  Social History Narrative   Lives at home with her husband.   Right-handed.   1-2 cups caffeine per day.     Family History: The patient's family history includes Heart attack in her father and mother; Heart disease in her father.  ROS:   Please see the history of present illness.    All other systems reviewed and are negative.  EKGs/Labs/Other Studies Reviewed:    The following studies were reviewed today: I discussed my findings with the patient at extensive length.   Recent Labs: 06/26/2017: ALT 9; BUN 14; Creatinine, Ser 1.24; Hemoglobin 12.5; Platelets 277; Potassium 5.0; Sodium 142  Recent Lipid Panel No results found for: CHOL, TRIG, HDL, CHOLHDL, VLDL, LDLCALC, LDLDIRECT  Physical Exam:    VS:  BP 124/70   Pulse (!) 57   Ht 5\' 4"  (1.626 m)   Wt 116 lb (52.6 kg)   SpO2 97%   BMI 19.91 kg/m     Wt Readings from Last 3 Encounters:  02/14/18 116 lb (52.6 kg)  06/26/17 117 lb 8 oz (53.3 kg)  03/26/17 123 lb (55.8 kg)     GEN: Patient is in no acute distress HEENT: Normal NECK: No JVD; No carotid bruits LYMPHATICS: No lymphadenopathy CARDIAC: Hear sounds  regular, 2/6 systolic murmur at the apex. RESPIRATORY:  Clear to auscultation without rales, wheezing or rhonchi  ABDOMEN: Soft, non-tender, non-distended MUSCULOSKELETAL:  No edema; No deformity  SKIN: Warm and dry NEUROLOGIC:  Alert and oriented x 3 PSYCHIATRIC:  Normal affect   Signed, Garwin Brothers, MD  02/14/2018 10:34 AM    [facility]

## 2018-02-18 DIAGNOSIS — E782 Mixed hyperlipidemia: Secondary | ICD-10-CM | POA: Diagnosis not present

## 2018-02-18 DIAGNOSIS — I10 Essential (primary) hypertension: Secondary | ICD-10-CM | POA: Diagnosis not present

## 2018-02-18 DIAGNOSIS — I25119 Atherosclerotic heart disease of native coronary artery with unspecified angina pectoris: Secondary | ICD-10-CM | POA: Diagnosis not present

## 2018-02-19 ENCOUNTER — Telehealth: Payer: Self-pay

## 2018-02-19 LAB — BASIC METABOLIC PANEL
BUN/Creatinine Ratio: 10 — ABNORMAL LOW (ref 12–28)
BUN: 12 mg/dL (ref 8–27)
CALCIUM: 9.4 mg/dL (ref 8.7–10.3)
CO2: 23 mmol/L (ref 20–29)
CREATININE: 1.17 mg/dL — AB (ref 0.57–1.00)
Chloride: 103 mmol/L (ref 96–106)
GFR calc Af Amer: 52 mL/min/{1.73_m2} — ABNORMAL LOW (ref >59)
GFR calc non Af Amer: 45 mL/min/{1.73_m2} — ABNORMAL LOW (ref >59)
GLUCOSE: 95 mg/dL (ref 65–99)
Potassium: 4.6 mmol/L (ref 3.5–5.2)
Sodium: 142 mmol/L (ref 134–144)

## 2018-02-19 LAB — HEPATIC FUNCTION PANEL
ALBUMIN: 4.7 g/dL (ref 3.7–4.7)
ALT: 10 IU/L (ref 0–32)
AST: 15 IU/L (ref 0–40)
Alkaline Phosphatase: 60 IU/L (ref 39–117)
Bilirubin Total: 0.3 mg/dL (ref 0.0–1.2)
Bilirubin, Direct: 0.08 mg/dL (ref 0.00–0.40)
TOTAL PROTEIN: 6.8 g/dL (ref 6.0–8.5)

## 2018-02-19 LAB — LIPID PANEL
CHOL/HDL RATIO: 4.6 ratio — AB (ref 0.0–4.4)
CHOLESTEROL TOTAL: 253 mg/dL — AB (ref 100–199)
HDL: 55 mg/dL (ref >39)
LDL Calculated: 169 mg/dL — ABNORMAL HIGH (ref 0–99)
TRIGLYCERIDES: 147 mg/dL (ref 0–149)
VLDL Cholesterol Cal: 29 mg/dL (ref 5–40)

## 2018-02-19 LAB — CBC
HEMATOCRIT: 38.5 % (ref 34.0–46.6)
HEMOGLOBIN: 12.7 g/dL (ref 11.1–15.9)
MCH: 31 pg (ref 26.6–33.0)
MCHC: 33 g/dL (ref 31.5–35.7)
MCV: 94 fL (ref 79–97)
Platelets: 268 10*3/uL (ref 150–450)
RBC: 4.1 x10E6/uL (ref 3.77–5.28)
RDW: 12.7 % (ref 11.7–15.4)
WBC: 5.6 10*3/uL (ref 3.4–10.8)

## 2018-02-19 LAB — TSH: TSH: 2.44 u[IU]/mL (ref 0.450–4.500)

## 2018-02-19 MED ORDER — ATORVASTATIN CALCIUM 10 MG PO TABS: 10.0000 mg | ORAL_TABLET | Freq: Every day | ORAL | 3 refills | Status: DC

## 2018-02-19 MED ORDER — COENZYME Q10 200 MG PO CAPS: 200.0000 mg | ORAL_CAPSULE | Freq: Every day | ORAL | 3 refills | Status: DC

## 2018-02-19 NOTE — Telephone Encounter (Signed)
-----   Message from Garwin Brothers, MD sent at 02/19/2018  8:13 AM EST ----- The results of the study is unremarkable. Please inform patient. Kidney function is stable. High lipids.Rosuvastatin 10mg  and ll 6wks. I will discuss in detail at next appointment. Cc  primary care/referring physician Garwin Brothers, MD 02/19/2018 8:12 AM

## 2018-02-19 NOTE — Telephone Encounter (Signed)
The patient has been notified of the result and verbally agreed to start atorvastatin. Prescription sent to requested pharmacy.  Copy of results forwarded to Dr. Rodman Key.

## 2018-02-19 NOTE — Telephone Encounter (Signed)
-----   Message from Garwin Brothers, MD sent at 02/19/2018 10:20 AM EST ----- Regarding: RE: Alternate medication request Atorva 20 daily and take OTC coq10  200mg  daily and ll check in 6wks ----- Message ----- From: Pamala Hurry, RN Sent: 02/19/2018   9:28 AM EST To: Garwin Brothers, MD Subject: Alternate medication request                   Patient has an allergy to rosuvastatin causing weakness. What alternative would you like to use?   Susan [name] ----- Message ----- From: Garwin Brothers, MD Sent: 02/19/2018   8:13 AM EST To: Pamala Hurry, RN  The results of the study is unremarkable. Please inform patient. Kidney function is stable. High lipids.Rosuvastatin 10mg  and ll 6wks. I will discuss in detail at next appointment. Cc  primary care/referring physician Garwin Brothers, MD 02/19/2018 8:12 AM

## 2018-03-06 ENCOUNTER — Other Ambulatory Visit: Payer: Self-pay | Admitting: Cardiology

## 2018-03-18 DIAGNOSIS — G3184 Mild cognitive impairment, so stated: Secondary | ICD-10-CM | POA: Diagnosis not present

## 2018-03-18 DIAGNOSIS — G40201 Localization-related (focal) (partial) symptomatic epilepsy and epileptic syndromes with complex partial seizures, not intractable, with status epilepticus: Secondary | ICD-10-CM | POA: Diagnosis not present

## 2018-03-18 DIAGNOSIS — G2581 Restless legs syndrome: Secondary | ICD-10-CM | POA: Diagnosis not present

## 2018-03-18 DIAGNOSIS — G459 Transient cerebral ischemic attack, unspecified: Secondary | ICD-10-CM | POA: Diagnosis not present

## 2018-06-02 ENCOUNTER — Other Ambulatory Visit: Payer: Self-pay | Admitting: Cardiology

## 2018-08-19 ENCOUNTER — Ambulatory Visit (INDEPENDENT_AMBULATORY_CARE_PROVIDER_SITE_OTHER): Payer: Medicare Other | Admitting: Cardiology

## 2018-08-19 ENCOUNTER — Encounter: Payer: Self-pay | Admitting: Cardiology

## 2018-08-19 ENCOUNTER — Other Ambulatory Visit: Payer: Self-pay

## 2018-08-19 VITALS — BP 154/70 | HR 57 | Ht 64.0 in | Wt 114.0 lb

## 2018-08-19 DIAGNOSIS — I10 Essential (primary) hypertension: Secondary | ICD-10-CM | POA: Diagnosis not present

## 2018-08-19 DIAGNOSIS — I739 Peripheral vascular disease, unspecified: Secondary | ICD-10-CM | POA: Diagnosis not present

## 2018-08-19 DIAGNOSIS — E782 Mixed hyperlipidemia: Secondary | ICD-10-CM | POA: Diagnosis not present

## 2018-08-19 DIAGNOSIS — Z951 Presence of aortocoronary bypass graft: Secondary | ICD-10-CM

## 2018-08-19 DIAGNOSIS — I25119 Atherosclerotic heart disease of native coronary artery with unspecified angina pectoris: Secondary | ICD-10-CM | POA: Diagnosis not present

## 2018-08-19 DIAGNOSIS — Z8673 Personal history of transient ischemic attack (TIA), and cerebral infarction without residual deficits: Secondary | ICD-10-CM

## 2018-08-19 DIAGNOSIS — Z1329 Encounter for screening for other suspected endocrine disorder: Secondary | ICD-10-CM

## 2018-08-19 NOTE — Progress Notes (Signed)
Cardiology Office Note:    Date:  08/19/2018   ID:  Susan [name], DOB Aug 15, 1940, MRN [id number]  PCP:  Marylen PontoHolt, Lynley S, MD  Cardiologist:  Garwin Brothersajan R Revankar, MD   Referring MD: Marylen PontoHolt, Lynley S, MD    ASSESSMENT:    1. Coronary artery disease involving native coronary artery of native heart with angina pectoris (HCC)   2. Essential hypertension   3. Mixed dyslipidemia   4. Intermittent claudication (HCC)   5. Hx of CABG   6. H/O: CVA (cerebrovascular accident)    PLAN:    In order of problems listed above:  1. Coronary artery disease: Secondary prevention stressed with the patient.  Importance of compliance with diet and medication stressed and she vocalized understanding. 2. Essential hypertension: Her blood pressure stable 3. Mixed dyslipidemia: Diet was discussed she will be back in the next few days for fasting blood work including lipids.  Importance of regular exercise and walking stressed she agrees to do so 4. Patient will be seen in follow-up appointment in 6 months or earlier if the patient has any concerns    Medication Adjustments/Labs and Tests Ordered: Current medicines are reviewed at length with the patient today.  Concerns regarding medicines are outlined above.  No orders of the defined types were placed in this encounter.  No orders of the defined types were placed in this encounter.    No chief complaint on file.    History of Present Illness:    Susan [name] is a 78 y.o. female.  Patient has past medical history of coronary artery disease and she denies any problems at this time and takes care of activities of daily living.  No chest pain orthopnea or PND.  She ambulates fairly well appropriate to her age.  She does not exercise.  Overall she leads a sedentary lifestyle.  At the time of my evaluation, the patient is alert awake oriented and in no distress.  Past Medical History:  Diagnosis Date  . Arthritis   . Carotid artery occlusion   . GERD  (gastroesophageal reflux disease)   . Hyperlipidemia   . Hypertension   . Occlusion of carotid artery 09/20/2016  . Stroke (HCC) 2005  . Syncope     Past Surgical History:  Procedure Laterality Date  . ABDOMINAL HYSTERECTOMY    . CAROTID ENDARTERECTOMY    . CORONARY ARTERY BYPASS GRAFT    . LEFT HEART CATH AND CORS/GRAFTS ANGIOGRAPHY N/A 08/02/2016   Procedure: Left Heart Cath and Cors/Grafts Angiography;  Surgeon: Runell GessBerry, Jonathan J, MD;  [facility];  Service: Cardiovascular;  Laterality: N/A;    Current Medications: Current Meds  Medication Sig  . atorvastatin (LIPITOR) 10 MG tablet TAKE ONE TABLET BY MOUTH ONCE DAILY  . carvedilol (COREG) 6.25 MG tablet Take 6.25 mg by mouth 2 (two) times daily with a meal.  . donepezil (ARICEPT) 10 MG tablet Take 10 mg by mouth at bedtime.  . isosorbide mononitrate (IMDUR) 30 MG 24 hr tablet TAKE ONE TABLET BY MOUTH DAILY  . lamoTRIgine (LAMICTAL) 100 MG tablet TAKE ONE TABLET BY MOUTH TWICE DAILY  . levETIRAcetam (KEPPRA) 500 MG tablet TAKE ONE TABLET BY MOUTH TWICE DAILY  . omeprazole (PRILOSEC) 40 MG capsule Take 30 mg by mouth daily.  Marland Kitchen. omeprazole (PRILOSEC) 40 MG capsule Take 40 mg by mouth daily.  . ramipril (ALTACE) 5 MG capsule TAKE ONE CAPSULE BY MOUTH ONCE DAILY  . rOPINIRole (REQUIP) 1 MG tablet Take by  mouth.  . Vitamin D, Ergocalciferol, (DRISDOL) 1.25 MG (50000 UT) CAPS capsule Take 50,000 Units by mouth once a week.  . [DISCONTINUED] carvedilol (COREG) 6.25 MG tablet TAKE ONE TABLET BY MOUTH TWICE DAILY with a meal     Allergies:   Boniva [ibandronic acid], Crestor [rosuvastatin calcium], Fenofibrate, Vytorin [ezetimibe-simvastatin], Welchol [colesevelam hcl], and Zetia [ezetimibe]   Social History   Socioeconomic History  . Marital status: Married    Spouse name: Not on file  . Number of children: 1  . Years of education: 10th grade  . Highest education level: Not on file  Occupational History  .  Occupation: Retired  Engineer, productionocial Needs  . Financial resource strain: Not on file  . Food insecurity    Worry: Not on file    Inability: Not on file  . Transportation needs    Medical: Not on file    Non-medical: Not on file  Tobacco Use  . Smoking status: Former Smoker    Packs/day: 0.50    Years: 20.00    Pack years: 10.00    Types: Cigarettes    Quit date: 03/07/1981    Years since quitting: 37.4  . Smokeless tobacco: Never Used  Substance and Sexual Activity  . Alcohol use: No  . Drug use: No  . Sexual activity: Not on file  Lifestyle  . Physical activity    Days per week: Not on file    Minutes per session: Not on file  . Stress: Not on file  Relationships  . Social Musicianconnections    Talks on phone: Not on file    Gets together: Not on file    Attends religious service: Not on file    Active member of club or organization: Not on file    Attends meetings of clubs or organizations: Not on file    Relationship status: Not on file  Other Topics Concern  . Not on file  Social History Narrative   Lives at home with her husband.   Right-handed.   1-2 cups caffeine per day.     Family History: The patient's family history includes Heart attack in her father and mother; Heart disease in her father.  ROS:   Please see the history of present illness.    All other systems reviewed and are negative.  EKGs/Labs/Other Studies Reviewed:    The following studies were reviewed today: EKG reveals sinus rhythm and nonspecific ST-T changes   Recent Labs: 02/18/2018: ALT 10; BUN 12; Creatinine, Ser 1.17; Hemoglobin 12.7; Platelets 268; Potassium 4.6; Sodium 142; TSH 2.440  Recent Lipid Panel    Component Value Date/Time   CHOL 253 (H) 02/18/2018 0840   TRIG 147 02/18/2018 0840   HDL 55 02/18/2018 0840   CHOLHDL 4.6 (H) 02/18/2018 0840   LDLCALC 169 (H) 02/18/2018 0840    Physical Exam:    VS:  BP (!) 154/70 (BP Location: Left Arm, Patient Position: Sitting, Cuff Size:  Normal)   Pulse (!) 57   Ht 5\' 4"  (1.626 m)   Wt 114 lb (51.7 kg)   SpO2 97%   BMI 19.57 kg/m     Wt Readings from Last 3 Encounters:  08/19/18 114 lb (51.7 kg)  02/14/18 116 lb (52.6 kg)  06/26/17 117 lb 8 oz (53.3 kg)     GEN: Patient is in no acute distress HEENT: Normal NECK: No JVD; No carotid bruits LYMPHATICS: No lymphadenopathy CARDIAC: Hear sounds regular, 2/6 systolic murmur at the apex. RESPIRATORY:  Clear to auscultation without rales, wheezing or rhonchi  ABDOMEN: Soft, non-tender, non-distended MUSCULOSKELETAL:  No edema; No deformity  SKIN: Warm and dry NEUROLOGIC:  Alert and oriented x 3 PSYCHIATRIC:  Normal affect   Signed, Jenean Lindau, MD  08/19/2018 2:03 PM    [place]

## 2018-08-19 NOTE — Patient Instructions (Signed)
Medication Instructions:  Your physician recommends that you continue on your current medications as directed. Please refer to the Current Medication list given to you today.  If you need a refill on your cardiac medications before your next appointment, please call your pharmacy.   Lab work: Your physician recommends that you return FASTING to have a lipid, hepatic, TSH, CBC, BMP drawn.  If you have labs (blood work) drawn today and your tests are completely normal, you will receive your results only by: Marland Kitchen MyChart Message (if you have MyChart) OR . A paper copy in the mail If you have any lab test that is abnormal or we need to change your treatment, we will call you to review the results.  Testing/Procedures: You had an EKG performed today   Follow-Up: At [facility], you and your health needs are our priority.  As part of our continuing mission to provide you with exceptional heart care, we have created designated Provider Care Teams.  These Care Teams include your primary Cardiologist (physician) and Advanced Practice Providers (APPs -  Physician Assistants and Nurse Practitioners) who all work together to provide you with the care you need, when you need it. You will need a follow up appointment in 6 months.

## 2018-08-19 NOTE — Addendum Note (Signed)
Addended by: Beckey Rutter on: 08/19/2018 02:23 PM   Modules accepted: Orders

## 2018-08-22 DIAGNOSIS — Z1329 Encounter for screening for other suspected endocrine disorder: Secondary | ICD-10-CM | POA: Diagnosis not present

## 2018-08-22 DIAGNOSIS — I10 Essential (primary) hypertension: Secondary | ICD-10-CM | POA: Diagnosis not present

## 2018-08-22 DIAGNOSIS — I25119 Atherosclerotic heart disease of native coronary artery with unspecified angina pectoris: Secondary | ICD-10-CM | POA: Diagnosis not present

## 2018-08-22 DIAGNOSIS — E782 Mixed hyperlipidemia: Secondary | ICD-10-CM | POA: Diagnosis not present

## 2018-08-22 DIAGNOSIS — Z951 Presence of aortocoronary bypass graft: Secondary | ICD-10-CM | POA: Diagnosis not present

## 2018-08-23 LAB — BASIC METABOLIC PANEL
BUN/Creatinine Ratio: 10 — ABNORMAL LOW (ref 12–28)
BUN: 12 mg/dL (ref 8–27)
CO2: 25 mmol/L (ref 20–29)
Calcium: 9.8 mg/dL (ref 8.7–10.3)
Chloride: 98 mmol/L (ref 96–106)
Creatinine, Ser: 1.23 mg/dL — ABNORMAL HIGH (ref 0.57–1.00)
GFR calc Af Amer: 49 mL/min/{1.73_m2} — ABNORMAL LOW (ref >59)
GFR calc non Af Amer: 42 mL/min/{1.73_m2} — ABNORMAL LOW (ref >59)
Glucose: 99 mg/dL (ref 65–99)
Potassium: 4.5 mmol/L (ref 3.5–5.2)
Sodium: 139 mmol/L (ref 134–144)

## 2018-08-23 LAB — TSH: TSH: 2 u[IU]/mL (ref 0.450–4.500)

## 2018-08-23 LAB — CBC
Hematocrit: 37.9 % (ref 34.0–46.6)
Hemoglobin: 12.9 g/dL (ref 11.1–15.9)
MCH: 30.9 pg (ref 26.6–33.0)
MCHC: 34 g/dL (ref 31.5–35.7)
MCV: 91 fL (ref 79–97)
Platelets: 268 10*3/uL (ref 150–450)
RBC: 4.17 x10E6/uL (ref 3.77–5.28)
RDW: 11.9 % (ref 11.7–15.4)
WBC: 6.9 10*3/uL (ref 3.4–10.8)

## 2018-08-23 LAB — HEPATIC FUNCTION PANEL
ALT: 9 IU/L (ref 0–32)
AST: 18 IU/L (ref 0–40)
Albumin: 4.9 g/dL — ABNORMAL HIGH (ref 3.7–4.7)
Alkaline Phosphatase: 68 IU/L (ref 39–117)
Bilirubin Total: 0.5 mg/dL (ref 0.0–1.2)
Bilirubin, Direct: 0.14 mg/dL (ref 0.00–0.40)
Total Protein: 7.2 g/dL (ref 6.0–8.5)

## 2018-08-23 LAB — LIPID PANEL
Chol/HDL Ratio: 2.9 ratio (ref 0.0–4.4)
Cholesterol, Total: 179 mg/dL (ref 100–199)
HDL: 61 mg/dL (ref >39)
LDL Calculated: 100 mg/dL — ABNORMAL HIGH (ref 0–99)
Triglycerides: 91 mg/dL (ref 0–149)
VLDL Cholesterol Cal: 18 mg/dL (ref 5–40)

## 2018-10-04 DIAGNOSIS — Z23 Encounter for immunization: Secondary | ICD-10-CM | POA: Diagnosis not present

## 2018-10-06 ENCOUNTER — Other Ambulatory Visit: Payer: Self-pay | Admitting: Cardiology

## 2018-11-11 DIAGNOSIS — I10 Essential (primary) hypertension: Secondary | ICD-10-CM | POA: Diagnosis not present

## 2018-11-11 DIAGNOSIS — E538 Deficiency of other specified B group vitamins: Secondary | ICD-10-CM | POA: Diagnosis not present

## 2018-11-11 DIAGNOSIS — K219 Gastro-esophageal reflux disease without esophagitis: Secondary | ICD-10-CM | POA: Diagnosis not present

## 2018-11-11 DIAGNOSIS — G2581 Restless legs syndrome: Secondary | ICD-10-CM | POA: Diagnosis not present

## 2018-11-11 DIAGNOSIS — E559 Vitamin D deficiency, unspecified: Secondary | ICD-10-CM | POA: Diagnosis not present

## 2018-11-11 DIAGNOSIS — Z0000 Encounter for general adult medical examination without abnormal findings: Secondary | ICD-10-CM | POA: Diagnosis not present

## 2018-11-11 DIAGNOSIS — E782 Mixed hyperlipidemia: Secondary | ICD-10-CM | POA: Diagnosis not present

## 2018-11-11 DIAGNOSIS — G40909 Epilepsy, unspecified, not intractable, without status epilepticus: Secondary | ICD-10-CM | POA: Diagnosis not present

## 2019-03-12 ENCOUNTER — Other Ambulatory Visit: Payer: Self-pay | Admitting: Cardiology

## 2019-03-25 ENCOUNTER — Other Ambulatory Visit: Payer: Self-pay | Admitting: Cardiology

## 2019-03-26 ENCOUNTER — Encounter: Payer: Self-pay | Admitting: Cardiology

## 2019-03-26 ENCOUNTER — Ambulatory Visit (INDEPENDENT_AMBULATORY_CARE_PROVIDER_SITE_OTHER): Payer: Medicare Other | Admitting: Cardiology

## 2019-03-26 ENCOUNTER — Other Ambulatory Visit: Payer: Self-pay

## 2019-03-26 VITALS — BP 138/80 | HR 68 | Ht 64.0 in | Wt 108.0 lb

## 2019-03-26 DIAGNOSIS — I10 Essential (primary) hypertension: Secondary | ICD-10-CM | POA: Diagnosis not present

## 2019-03-26 DIAGNOSIS — R0989 Other specified symptoms and signs involving the circulatory and respiratory systems: Secondary | ICD-10-CM | POA: Diagnosis not present

## 2019-03-26 DIAGNOSIS — I25119 Atherosclerotic heart disease of native coronary artery with unspecified angina pectoris: Secondary | ICD-10-CM | POA: Diagnosis not present

## 2019-03-26 DIAGNOSIS — E782 Mixed hyperlipidemia: Secondary | ICD-10-CM

## 2019-03-26 DIAGNOSIS — Z951 Presence of aortocoronary bypass graft: Secondary | ICD-10-CM

## 2019-03-26 NOTE — Progress Notes (Signed)
Cardiology Office Note:    Date:  03/26/2019  Yes to give her ID:  Susan [name], Susan [name] 1940-04-24, MRN [id number]  PCP:  Susan Broker, MD  Cardiologist:  Susan Lindau, MD   Referring MD: Susan Hipps, MD    ASSESSMENT:    1. Coronary artery disease involving native coronary artery of native heart with angina pectoris ([place])   2. Bilateral carotid bruits   3. Essential hypertension   4. Hx of CABG   5. Mixed dyslipidemia    PLAN:    In order of problems listed above:  1. Coronary artery disease: Secondary prevention stressed with the patient.  Importance of compliance with diet and medication stressed and she vocalized understanding. 2. Essential hypertension: Blood pressure is stable 3. Mixed dyslipidemia: Diet was discussed and lipids were reviewed and she will be back in the next few days for blood work including fasting lipids 4. Bilateral carotid bruit: Patient had atherosclerotic vascular disease in the carotid arteries and we will do a follow-up bilateral ultrasound to assess this. 5. Patient will be seen in follow-up appointment in 6 months or earlier if the patient has any concerns    Medication Adjustments/Labs and Tests Ordered: Current medicines are reviewed at length with the patient today.  Concerns regarding medicines are outlined above.  No orders of the defined types were placed in this encounter.  No orders of the defined types were placed in this encounter.    Chief Complaint  Patient presents with  . Follow-up    6 Months     History of Present Illness:    Susan [name] is a 79 y.o. female.  Patient has history of coronary artery disease, essential hypertension and dyslipidemia.  She denies any problems at this time and takes care of activities of daily living.  No chest pain orthopnea or PND.  At the time of my evaluation, the patient is alert awake oriented and in no distress.  Past Medical History:  Diagnosis Date  . Arthritis   .  Carotid artery occlusion   . GERD (gastroesophageal reflux disease)   . Hyperlipidemia   . Hypertension   . Occlusion of carotid artery 09/20/2016  . Stroke ([place]) 2005  . Syncope     Past Surgical History:  Procedure Laterality Date  . ABDOMINAL HYSTERECTOMY    . CAROTID ENDARTERECTOMY    . CORONARY ARTERY BYPASS GRAFT    . LEFT HEART CATH AND CORS/GRAFTS ANGIOGRAPHY N/A 08/02/2016   Procedure: Left Heart Cath and Cors/Grafts Angiography;  Surgeon: Lorretta Harp, MD;  Location: [facility];  Service: Cardiovascular;  Laterality: N/A;    Current Medications: Current Meds  Medication Sig  . acetaminophen (TYLENOL) 500 MG tablet Take 1,000 mg by mouth every 6 (six) hours as needed (for pain.).  Marland Kitchen aspirin 81 MG tablet Take 81 mg by mouth daily.   Marland Kitchen atorvastatin (LIPITOR) 10 MG tablet TAKE ONE TABLET BY MOUTH ONCE DAILY  . carvedilol (COREG) 6.25 MG tablet Take 6.25 mg by mouth 2 (two) times daily with a meal.  . Coenzyme Q10 200 MG capsule Take 1 capsule (200 mg total) by mouth daily.  . cyanocobalamin 1000 MCG tablet Take 1,000 mcg by mouth daily.  . cyclobenzaprine (FLEXERIL) 5 MG tablet Take 5 mg by mouth 3 (three) times daily as needed for muscle spasms.  Marland Kitchen donepezil (ARICEPT) 10 MG tablet Take 10 mg by mouth at bedtime.  . isosorbide mononitrate (IMDUR) 30 MG 24  hr tablet TAKE ONE TABLET BY MOUTH DAILY  . lamoTRIgine (LAMICTAL) 100 MG tablet TAKE ONE TABLET BY MOUTH TWICE DAILY  . levETIRAcetam (KEPPRA) 500 MG tablet TAKE ONE TABLET BY MOUTH TWICE DAILY  . memantine (NAMENDA) 5 MG tablet Take by mouth.  Marland Kitchen omeprazole (PRILOSEC) 40 MG capsule Take 30 mg by mouth daily.  . ramipril (ALTACE) 5 MG capsule Take 1 capsule (5 mg total) by mouth daily. Please schedule an overdue appointment with Dr.Damier [name] for further refills.  Marland Kitchen rOPINIRole (REQUIP) 2 MG tablet Take 2 mg by mouth at bedtime.  . sertraline (ZOLOFT) 25 MG tablet Take by mouth.  . Vitamin D, Ergocalciferol,  (DRISDOL) 1.25 MG (50000 UT) CAPS capsule Take 50,000 Units by mouth once a week.     Allergies:   Boniva [ibandronic acid], Crestor [rosuvastatin calcium], Fenofibrate, Vytorin [ezetimibe-simvastatin], Welchol [colesevelam hcl], and Zetia [ezetimibe]   Social History   Socioeconomic History  . Marital status: Married    Spouse name: Not on file  . Number of children: 1  . Years of education: 10th grade  . Highest education level: Not on file  Occupational History  . Occupation: Retired  Tobacco Use  . Smoking status: Former Smoker    Packs/day: 0.50    Years: 20.00    Pack years: 10.00    Types: Cigarettes    Quit date: 03/07/1981    Years since quitting: 38.0  . Smokeless tobacco: Never Used  Substance and Sexual Activity  . Alcohol use: No  . Drug use: No  . Sexual activity: Not on file  Other Topics Concern  . Not on file  Social History Narrative   Lives at home with her husband.   Right-handed.   1-2 cups caffeine per day.   Social Determinants of Health   Financial Resource Strain:   . Difficulty of Paying Living Expenses:   Food Insecurity:   . Worried About Programme researcher, broadcasting/film/video in the Last Year:   . Barista in the Last Year:   Transportation Needs:   . Freight forwarder (Medical):   Marland Kitchen Lack of Transportation (Non-Medical):   Physical Activity:   . Days of Exercise per Week:   . Minutes of Exercise per Session:   Stress:   . Feeling of Stress :   Social Connections:   . Frequency of Communication with Friends and Family:   . Frequency of Social Gatherings with Friends and Family:   . Attends Religious Services:   . Active Member of Clubs or Organizations:   . Attends Banker Meetings:   Marland Kitchen Marital Status:      Family History: The patient's family history includes Heart attack in her father and mother; Heart disease in her father.  ROS:   Please see the history of present illness.    All other systems reviewed and are  negative.  EKGs/Labs/Other Studies Reviewed:    The following studies were reviewed today: I discussed my findings with the patient at length.   Recent Labs: 08/22/2018: ALT 9; BUN 12; Creatinine, Ser 1.23; Hemoglobin 12.9; Platelets 268; Potassium 4.5; Sodium 139; TSH 2.000  Recent Lipid Panel    Component Value Date/Time   CHOL 179 08/22/2018 0906   TRIG 91 08/22/2018 0906   HDL 61 08/22/2018 0906   CHOLHDL 2.9 08/22/2018 0906   LDLCALC 100 (H) 08/22/2018 0906    Physical Exam:    VS:  BP 138/80   Pulse 68  Ht 5\' 4"  (1.626 m)   Wt 108 lb (49 kg)   SpO2 96%   BMI 18.54 kg/m     Wt Readings from Last 3 Encounters:  03/26/19 108 lb (49 kg)  08/19/18 114 lb (51.7 kg)  02/14/18 116 lb (52.6 kg)     GEN: Patient is in no acute distress HEENT: Normal NECK: No JVD; bilateral carotid bruits LYMPHATICS: No lymphadenopathy CARDIAC: Hear sounds regular, 2/6 systolic murmur at the apex. RESPIRATORY:  Clear to auscultation without rales, wheezing or rhonchi  ABDOMEN: Soft, non-tender, non-distended MUSCULOSKELETAL:  No edema; No deformity  SKIN: Warm and dry NEUROLOGIC:  Alert and oriented x 3 PSYCHIATRIC:  Normal affect   Signed, 02/16/18, MD  03/26/2019 1:59 PM    [facility]

## 2019-03-26 NOTE — Patient Instructions (Signed)
Medication Instructions:  No medication changes *If you need a refill on your cardiac medications before your next appointment, please call your pharmacy*   Lab Work: Your physician recommends that you return for lab work when you have your ultrasound. You need to have labs done when you are fasting.  You can come Monday through Friday 8:30 am to 12:00 pm and 1:15 to 4:30. You do not need to make an appointment as the order has already been placed. The labs you are going to have done are BMET, CBC, TSH, LFT and Lipids.  If you have labs (blood work) drawn today and your tests are completely normal, you will receive your results only by: Marland Kitchen MyChart Message (if you have MyChart) OR . A paper copy in the mail If you have any lab test that is abnormal or we need to change your treatment, we will call you to review the results.   Testing/Procedures: Your physician has requested that you have a carotid duplex. This test is an ultrasound of the carotid arteries in your neck. It looks at blood flow through these arteries that supply the brain with blood. Allow one hour for this exam. There are no restrictions or special instructions.    Follow-Up: At [facility], you and your health needs are our priority.  As part of our continuing mission to provide you with exceptional heart care, we have created designated Provider Care Teams.  These Care Teams include your primary Cardiologist (physician) and Advanced Practice Providers (APPs -  Physician Assistants and Nurse Practitioners) who all work together to provide you with the care you need, when you need it.  We recommend signing up for the patient portal called "MyChart".  Sign up information is provided on this After Visit Summary.  MyChart is used to connect with patients for Virtual Visits (Telemedicine).  Patients are able to view lab/test results, encounter notes, upcoming appointments, etc.  Non-urgent messages can be sent to your provider as  well.   To learn more about what you can do with MyChart, go to [email or web address].    Your next appointment:   6 month(s)  The format for your next appointment:   In Person  Provider:   Belva Crome, MD   Other Instructions Have a great day!!  Ultrasound Instructions  BE AWARE OF ULTRASOUND POLICY FEE, IF APPOINTMENT NEEDS TO BE CANCELLED OR RESCHEDULED DO SO WITHIN 48 HOURS OF SCHEDULED APPOINTMENT TO AVOID THE $100.00 FEE

## 2019-03-30 ENCOUNTER — Other Ambulatory Visit: Payer: Self-pay | Admitting: Cardiology

## 2019-03-31 LAB — LIPID PANEL
Chol/HDL Ratio: 2.4 ratio (ref 0.0–4.4)
Cholesterol, Total: 181 mg/dL (ref 100–199)
HDL: 75 mg/dL (ref >39)
LDL Chol Calc (NIH): 89 mg/dL (ref 0–99)
Triglycerides: 94 mg/dL (ref 0–149)
VLDL Cholesterol Cal: 17 mg/dL (ref 5–40)

## 2019-03-31 LAB — CBC WITH DIFFERENTIAL/PLATELET
Basophils Absolute: 0.1 10*3/uL (ref 0.0–0.2)
Basos: 2 %
EOS (ABSOLUTE): 0.4 10*3/uL (ref 0.0–0.4)
Eos: 5 %
Hematocrit: 38.9 % (ref 34.0–46.6)
Hemoglobin: 13 g/dL (ref 11.1–15.9)
Immature Grans (Abs): 0 10*3/uL (ref 0.0–0.1)
Immature Granulocytes: 0 %
Lymphocytes Absolute: 2.4 10*3/uL (ref 0.7–3.1)
Lymphs: 32 %
MCH: 30.3 pg (ref 26.6–33.0)
MCHC: 33.4 g/dL (ref 31.5–35.7)
MCV: 91 fL (ref 79–97)
Monocytes Absolute: 0.6 10*3/uL (ref 0.1–0.9)
Monocytes: 9 %
Neutrophils Absolute: 3.9 10*3/uL (ref 1.4–7.0)
Neutrophils: 52 %
Platelets: 245 10*3/uL (ref 150–450)
RBC: 4.29 x10E6/uL (ref 3.77–5.28)
RDW: 13.3 % (ref 11.7–15.4)
WBC: 7.4 10*3/uL (ref 3.4–10.8)

## 2019-03-31 LAB — HEPATIC FUNCTION PANEL
ALT: 11 IU/L (ref 0–32)
AST: 14 IU/L (ref 0–40)
Albumin: 4.4 g/dL (ref 3.7–4.7)
Alkaline Phosphatase: 77 IU/L (ref 39–117)
Bilirubin Total: 0.5 mg/dL (ref 0.0–1.2)
Bilirubin, Direct: 0.12 mg/dL (ref 0.00–0.40)
Total Protein: 7 g/dL (ref 6.0–8.5)

## 2019-03-31 LAB — BASIC METABOLIC PANEL
BUN/Creatinine Ratio: 12 (ref 12–28)
BUN: 13 mg/dL (ref 8–27)
CO2: 25 mmol/L (ref 20–29)
Calcium: 9.2 mg/dL (ref 8.7–10.3)
Chloride: 105 mmol/L (ref 96–106)
Creatinine, Ser: 1.05 mg/dL — ABNORMAL HIGH (ref 0.57–1.00)
GFR calc Af Amer: 58 mL/min/{1.73_m2} — ABNORMAL LOW (ref >59)
GFR calc non Af Amer: 51 mL/min/{1.73_m2} — ABNORMAL LOW (ref >59)
Glucose: 94 mg/dL (ref 65–99)
Potassium: 4.2 mmol/L (ref 3.5–5.2)
Sodium: 143 mmol/L (ref 134–144)

## 2019-03-31 LAB — TSH: TSH: 2.52 u[IU]/mL (ref 0.450–4.500)

## 2019-04-22 ENCOUNTER — Other Ambulatory Visit: Payer: Self-pay

## 2019-04-22 ENCOUNTER — Ambulatory Visit (HOSPITAL_BASED_OUTPATIENT_CLINIC_OR_DEPARTMENT_OTHER)
Admission: RE | Admit: 2019-04-22 | Discharge: 2019-04-22 | Disposition: A | Discharge status: Home / Self Care | Payer: Medicare Other | Source: Ambulatory Visit | Attending: Cardiology | Admitting: Cardiology

## 2019-04-22 DIAGNOSIS — R0989 Other specified symptoms and signs involving the circulatory and respiratory systems: Secondary | ICD-10-CM | POA: Insufficient documentation

## 2019-04-22 NOTE — Progress Notes (Addendum)
  VAS US CAROTID DUPLEX BILATERAL    Sinda Du 04/22/2019, 11:21 AM

## 2019-04-24 ENCOUNTER — Telehealth: Payer: Self-pay | Admitting: Cardiology

## 2019-04-24 DIAGNOSIS — I6522 Occlusion and stenosis of left carotid artery: Secondary | ICD-10-CM

## 2019-04-24 NOTE — Telephone Encounter (Signed)
   Pt's son calling, he said his mom told him she received a call, since she has dementia she couldn't remember what is the call about, he said to call him instead and he is assuming it's about the Carotid result  Please call

## 2019-04-28 ENCOUNTER — Other Ambulatory Visit: Payer: Self-pay | Admitting: Cardiology

## 2019-04-28 NOTE — Telephone Encounter (Signed)
Charted on results off carotid US.

## 2019-04-29 NOTE — Addendum Note (Signed)
Addended by: Eleonore Chiquito on: 04/29/2019 10:20 AM   Modules accepted: Orders

## 2019-04-29 NOTE — Telephone Encounter (Signed)
Spoke with Verdon Cummins who states that they would like for [place] to order the CT angio neck. Pt has only been with Dr.Robbins for 2 yrs. CT and BMET ordered in Epic.

## 2019-04-30 LAB — BASIC METABOLIC PANEL
BUN/Creatinine Ratio: 11 — ABNORMAL LOW (ref 12–28)
BUN: 13 mg/dL (ref 8–27)
CO2: 23 mmol/L (ref 20–29)
Calcium: 9 mg/dL (ref 8.7–10.3)
Chloride: 103 mmol/L (ref 96–106)
Creatinine, Ser: 1.16 mg/dL — ABNORMAL HIGH (ref 0.57–1.00)
GFR calc Af Amer: 52 mL/min/{1.73_m2} — ABNORMAL LOW (ref >59)
GFR calc non Af Amer: 45 mL/min/{1.73_m2} — ABNORMAL LOW (ref >59)
Glucose: 158 mg/dL — ABNORMAL HIGH (ref 65–99)
Potassium: 4.4 mmol/L (ref 3.5–5.2)
Sodium: 139 mmol/L (ref 134–144)

## 2019-05-13 ENCOUNTER — Telehealth: Payer: Self-pay

## 2019-05-13 NOTE — Telephone Encounter (Signed)
Message left for son regarding the results of the pt's neck angiogram.

## 2019-07-01 ENCOUNTER — Other Ambulatory Visit: Payer: Self-pay | Admitting: Cardiology

## 2019-07-05 DIAGNOSIS — I361 Nonrheumatic tricuspid (valve) insufficiency: Secondary | ICD-10-CM

## 2019-07-05 DIAGNOSIS — I340 Nonrheumatic mitral (valve) insufficiency: Secondary | ICD-10-CM

## 2019-07-27 IMAGING — NM NM MISC PROCEDURE
3 series · 18 of 18 positions shown · non-contrast
Comparison: none

[Series 1: wbr_r-proj_st rest_(id)_sa · 6.5mm · 6.51mm/px · 6 of 64 frames shown]
[frame 6/64]
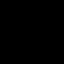
[frame 16/64]
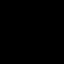
[frame 27/64]
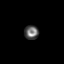
[frame 38/64]
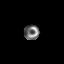
[frame 48/64]
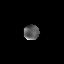
[frame 59/64]
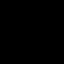

[Series 1: wbr_s-proj_st stress_(id)_sa · 6.5mm · 6.51mm/px · 6 of 64 frames shown (1 of 2)]
[frame 6/64]
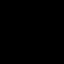
[frame 16/64]
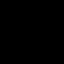
[frame 27/64]
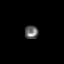
[frame 38/64]
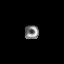
[frame 48/64]
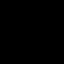
[frame 59/64]
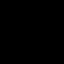

[Series 1: wbr_s-proj_st stress_(id)_sa · 6.5mm · 6.51mm/px · 6 of 512 frames shown (2 of 2)]
[frame 43/512]
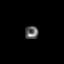
[frame 128/512]
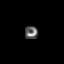
[frame 214/512]
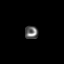
[frame 299/512]
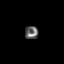
[frame 384/512]
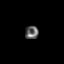
[frame 470/512]
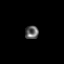

[18 of 18 positions shown; findings below may reference images not displayed]

Canned report from images found in remote index.

Refer to host system for actual result text.

## 2019-07-28 ENCOUNTER — Other Ambulatory Visit: Payer: Self-pay

## 2019-07-28 ENCOUNTER — Ambulatory Visit (INDEPENDENT_AMBULATORY_CARE_PROVIDER_SITE_OTHER): Payer: Medicare Other

## 2019-07-28 ENCOUNTER — Encounter: Payer: Self-pay | Admitting: Cardiology

## 2019-07-28 ENCOUNTER — Ambulatory Visit (INDEPENDENT_AMBULATORY_CARE_PROVIDER_SITE_OTHER): Payer: Medicare Other | Admitting: Cardiology

## 2019-07-28 VITALS — BP 150/78 | HR 57 | Ht 64.0 in | Wt 107.0 lb

## 2019-07-28 DIAGNOSIS — R55 Syncope and collapse: Secondary | ICD-10-CM

## 2019-07-28 DIAGNOSIS — I25119 Atherosclerotic heart disease of native coronary artery with unspecified angina pectoris: Secondary | ICD-10-CM

## 2019-07-28 DIAGNOSIS — Z8673 Personal history of transient ischemic attack (TIA), and cerebral infarction without residual deficits: Secondary | ICD-10-CM | POA: Diagnosis not present

## 2019-07-28 DIAGNOSIS — Z951 Presence of aortocoronary bypass graft: Secondary | ICD-10-CM

## 2019-07-28 DIAGNOSIS — I10 Essential (primary) hypertension: Secondary | ICD-10-CM

## 2019-07-28 DIAGNOSIS — E782 Mixed hyperlipidemia: Secondary | ICD-10-CM

## 2019-07-28 NOTE — Patient Instructions (Signed)
Medication Instructions:  Your physician recommends that you continue on your current medications as directed. Please refer to the Current Medication list given to you today.  *If you need a refill on your cardiac medications before your next appointment, please call your pharmacy*   Lab Work: None ordered   If you have labs (blood work) drawn today and your tests are completely normal, you will receive your results only by: Marland Kitchen MyChart Message (if you have MyChart) OR . A paper copy in the mail If you have any lab test that is abnormal or we need to change your treatment, we will call you to review the results.   Testing/Procedures: A zio monitor was ordered today. It will remain on for 14 days. You will then return monitor and event diary in provided box. It takes 1-2 weeks for report to be downloaded and returned to [place]. We will call you with the results. If monitor falls off or has orange flashing light, please call Zio for further instructions.     Follow-Up: At [facility], you and your health needs are our priority.  As part of our continuing mission to provide you with exceptional heart care, we have created designated Provider Care Teams.  These Care Teams include your primary Cardiologist (physician) and Advanced Practice Providers (APPs -  Physician Assistants and Nurse Practitioners) who all work together to provide you with the care you need, when you need it.  We recommend signing up for the patient portal called "MyChart".  Sign up information is provided on this After Visit Summary.  MyChart is used to connect with patients for Virtual Visits (Telemedicine).  Patients are able to view lab/test results, encounter notes, upcoming appointments, etc.  Non-urgent messages can be sent to your provider as well.   To learn more about what you can do with MyChart, go to [email or web address].    Your next appointment:   3 month(s)  The format for your next appointment:   In  Person  Provider:   Belva Crome, MD   Other Instructions None

## 2019-07-28 NOTE — Progress Notes (Addendum)
Cardiology Office Note:    Date:  07/28/2019   ID:  Susan [name], DOB 11/19/1940, MRN [id number]  PCP:  Hadley Pen, MD  Cardiologist:  Garwin Brothers, MD   Referring MD: Hadley Pen, MD    ASSESSMENT:    1. Coronary artery disease involving native coronary artery of native heart with angina pectoris (HCC)   2. Essential hypertension   3. H/O: CVA (cerebrovascular accident)   4. Hx of CABG   5. Mixed dyslipidemia    PLAN:    In order of problems listed above:  1. Syncopal episodes: Unclear etiology.  She has been evaluated extensively in the past.  I will do a 2-week monitor.  I reviewed [facility] hospital records extensively.  Patient's son mentions to me that she has had CT angiography of her neck and I will try to see if that report is available.  I did not find it under the [facility] system and I was told by nurse to look for it. 2. Essential hypertension: Blood pressure stable 3. History of stroke: CT scan of the head is abnormal.  And patient is managed by her primary care physician for this.  She also has dementia according to the records and based on my evaluation today.  She is not very communicative.  Addendum: I reviewed carotid study done on 05/08/2019 at  hospital and called her son and left a detailed message about the findings and medical management.   Medication Adjustments/Labs and Tests Ordered: Current medicines are reviewed at length with the patient today.  Concerns regarding medicines are outlined above.  No orders of the defined types were placed in this encounter.  No orders of the defined types were placed in this encounter.    No chief complaint on file.    History of Present Illness:    Susan [name] is a 79 y.o. female.  Patient has past medical history of coronary artery disease post CABG surgery, essential hypertension, history of multiple syncopal episodes, history of stroke.  She was recently in the hospital with a fall.   She has had syncopal episodes over the past several years and this is being evaluated by her primary care physician and possibly neurologist.  Her son accompanies her for this visit.  Past Medical History:  Diagnosis Date   Arthritis    Carotid artery occlusion    GERD (gastroesophageal reflux disease)    Hyperlipidemia    Hypertension    Occlusion of carotid artery 09/20/2016   Stroke [facility]) 2005   Syncope     Past Surgical History:  Procedure Laterality Date   ABDOMINAL HYSTERECTOMY     CAROTID ENDARTERECTOMY     CORONARY ARTERY BYPASS GRAFT     LEFT HEART CATH AND CORS/GRAFTS ANGIOGRAPHY N/A 08/02/2016   Procedure: Left Heart Cath and Cors/Grafts Angiography;  Surgeon: Runell Gess, MD;  Location: [facility] INVASIVE CV LAB;  Service: Cardiovascular;  Laterality: N/A;    Current Medications: Current Meds  Medication Sig   albuterol (VENTOLIN HFA) 108 (90 Base) MCG/ACT inhaler Inhale 2 puffs into the lungs every 4 (four) hours as needed for wheezing or shortness of breath.   aspirin 81 MG tablet Take 81 mg by mouth daily.    atorvastatin (LIPITOR) 10 MG tablet TAKE ONE TABLET BY MOUTH ONCE DAILY   carvedilol (COREG) 3.125 MG tablet Take 3.125 mg by mouth 2 (two) times daily with a meal.   Cholecalciferol 1.25 MG (50000 UT) TABS Take 1  capsule by mouth daily.   Coenzyme Q10 200 MG capsule Take 1 capsule (200 mg total) by mouth daily.   cyanocobalamin 1000 MCG tablet Take 1,000 mcg by mouth daily.   donepezil (ARICEPT) 10 MG tablet Take 10 mg by mouth at bedtime.   isosorbide mononitrate (IMDUR) 30 MG 24 hr tablet TAKE ONE TABLET BY MOUTH DAILY   lamoTRIgine (LAMICTAL) 100 MG tablet TAKE ONE TABLET BY MOUTH TWICE DAILY   levETIRAcetam (KEPPRA) 500 MG tablet TAKE ONE TABLET BY MOUTH TWICE DAILY   magnesium oxide (MAG-OX) 400 MG tablet Take 1 tablet by mouth daily.   meclizine (ANTIVERT) 25 MG tablet Take 25 mg by mouth 2 (two) times daily as needed for  dizziness.   memantine (NAMENDA) 5 MG tablet Take by mouth.   OMEGA-3 FATTY ACIDS PO Take 1 capsule by mouth daily.   omeprazole (PRILOSEC) 40 MG capsule Take 30 mg by mouth daily.   ondansetron (ZOFRAN) 4 MG tablet Take 4 mg by mouth every 6 (six) hours as needed for nausea or vomiting.   ramipril (ALTACE) 5 MG capsule Take 1 capsule (5 mg total) by mouth daily. Please schedule an overdue appointment with Dr.Bunnie Lederman for further refills.   rOPINIRole (REQUIP) 2 MG tablet Take 2 mg by mouth at bedtime.   sertraline (ZOLOFT) 25 MG tablet Take by mouth.     Allergies:   Boniva [ibandronic acid], Crestor [rosuvastatin calcium], Fenofibrate, Vytorin [ezetimibe-simvastatin], Welchol [colesevelam hcl], and Zetia [ezetimibe]   Social History   Socioeconomic History   Marital status: Married    Spouse name: Not on file   Number of children: 1   Years of education: 10th grade   Highest education level: Not on file  Occupational History   Occupation: Retired  Tobacco Use   Smoking status: Former Smoker    Packs/day: 0.50    Years: 20.00    Pack years: 10.00    Types: Cigarettes    Quit date: 03/07/1981    Years since quitting: 38.4   Smokeless tobacco: Never Used  Vaping Use   Vaping Use: Never used  Substance and Sexual Activity   Alcohol use: No   Drug use: No   Sexual activity: Not on file  Other Topics Concern   Not on file  Social History Narrative   Lives at home with her husband.   Right-handed.   1-2 cups caffeine per day.   Social Determinants of Health   Financial Resource Strain:    Difficulty of Paying Living Expenses:   Food Insecurity:    Worried About Programme researcher, broadcasting/film/video in the Last Year:    Barista in the Last Year:   Transportation Needs:    Freight forwarder (Medical):    Lack of Transportation (Non-Medical):   Physical Activity:    Days of Exercise per Week:    Minutes of Exercise per Session:   Stress:     Feeling of Stress :   Social Connections:    Frequency of Communication with Friends and Family:    Frequency of Social Gatherings with Friends and Family:    Attends Religious Services:    Active Member of Clubs or Organizations:    Attends Banker Meetings:    Marital Status:      Family History: The patient's family history includes Heart attack in her father and mother; Heart disease in her father.  ROS:   Please see the history of present illness.  All other systems reviewed and are negative.  EKGs/Labs/Other Studies Reviewed:    The following studies were reviewed today: I reviewed records from [place] hospital extensively.   Recent Labs: 03/30/2019: ALT 11; Hemoglobin 13.0; Platelets 245; TSH 2.520 04/30/2019: BUN 13; Creatinine, Ser 1.16; Potassium 4.4; Sodium 139  Recent Lipid Panel    Component Value Date/Time   CHOL 181 03/30/2019 0823   TRIG 94 03/30/2019 0823   HDL 75 03/30/2019 0823   CHOLHDL 2.4 03/30/2019 0823   LDLCALC 89 03/30/2019 0823    Physical Exam:    VS:  BP (!) 150/78    Pulse 57    Ht 5\' 4"  (1.626 m)    Wt 107 lb (48.5 kg)    BMI 18.37 kg/m     Wt Readings from Last 3 Encounters:  07/28/19 107 lb (48.5 kg)  03/26/19 108 lb (49 kg)  08/19/18 114 lb (51.7 kg)     GEN: Patient is in no acute distress HEENT: Normal NECK: No JVD; No carotid bruits LYMPHATICS: No lymphadenopathy CARDIAC: Hear sounds regular, 2/6 systolic murmur at the apex. RESPIRATORY:  Clear to auscultation without rales, wheezing or rhonchi  ABDOMEN: Soft, non-tender, non-distended MUSCULOSKELETAL:  No edema; No deformity  SKIN: Warm and dry NEUROLOGIC:  Alert and oriented x 3 PSYCHIATRIC:  Normal affect   Signed, 08/21/18, MD  07/28/2019 11:27 AM    [facility]

## 2019-10-29 ENCOUNTER — Ambulatory Visit: Payer: Medicare Other | Admitting: Cardiology

## 2019-11-24 ENCOUNTER — Ambulatory Visit: Payer: Medicare Other | Admitting: Cardiology

## 2021-04-28 DIAGNOSIS — R001 Bradycardia, unspecified: Secondary | ICD-10-CM | POA: Diagnosis not present

## 2022-04-26 DIAGNOSIS — I493 Ventricular premature depolarization: Secondary | ICD-10-CM

## 2022-05-03 ENCOUNTER — Encounter: Payer: Self-pay | Admitting: Medical

## 2022-06-02 DEATH — deceased
